# Patient Record
Sex: Female | Born: 1980
Health system: Southern US, Community
[De-identification: ages and names within clinical notes are randomized; demographics above are authoritative.]

## PROBLEM LIST (undated history)

## (undated) DIAGNOSIS — O24419 Gestational diabetes mellitus in pregnancy, unspecified control: Secondary | ICD-10-CM

## (undated) DIAGNOSIS — O09529 Supervision of elderly multigravida, unspecified trimester: Secondary | ICD-10-CM

## (undated) DIAGNOSIS — R87629 Unspecified abnormal cytological findings in specimens from vagina: Secondary | ICD-10-CM

## (undated) DIAGNOSIS — D259 Leiomyoma of uterus, unspecified: Secondary | ICD-10-CM

## (undated) DIAGNOSIS — D573 Sickle-cell trait: Secondary | ICD-10-CM

## (undated) DIAGNOSIS — Z889 Allergy status to unspecified drugs, medicaments and biological substances status: Secondary | ICD-10-CM

## (undated) DIAGNOSIS — K8020 Calculus of gallbladder without cholecystitis without obstruction: Secondary | ICD-10-CM

## (undated) HISTORY — DX: Sickle-cell trait: D57.3

## (undated) HISTORY — DX: Supervision of elderly multigravida, unspecified trimester: O09.529

## (undated) HISTORY — DX: Unspecified abnormal cytological findings in specimens from vagina: R87.629

## (undated) HISTORY — DX: Allergy status to unspecified drugs, medicaments and biological substances: Z88.9

## (undated) HISTORY — PX: OTHER SURGICAL HISTORY: SHX169

## (undated) HISTORY — PX: LEG SURGERY: SHX1003

## (undated) HISTORY — DX: Calculus of gallbladder without cholecystitis without obstruction: K80.20

---

## 2000-10-19 ENCOUNTER — Emergency Department: Admission: EM | Admit: 2000-10-19 | Discharge: 2000-10-19 | Payer: Self-pay | Admitting: Emergency Medicine

## 2002-06-28 ENCOUNTER — Emergency Department (HOSPITAL_COMMUNITY): Admission: EM | Admit: 2002-06-28 | Discharge: 2002-06-28 | Payer: Self-pay | Admitting: Emergency Medicine

## 2002-06-28 ENCOUNTER — Encounter: Payer: Self-pay | Admitting: Emergency Medicine

## 2003-03-10 ENCOUNTER — Encounter: Admission: RE | Admit: 2003-03-10 | Discharge: 2003-04-04 | Payer: Self-pay | Admitting: Plastic Surgery

## 2003-07-06 ENCOUNTER — Other Ambulatory Visit: Admission: RE | Admit: 2003-07-06 | Discharge: 2003-07-06 | Payer: Self-pay | Admitting: Internal Medicine

## 2004-05-15 ENCOUNTER — Ambulatory Visit: Payer: Self-pay | Admitting: Internal Medicine

## 2004-05-21 ENCOUNTER — Ambulatory Visit: Payer: Self-pay | Admitting: Internal Medicine

## 2004-05-31 ENCOUNTER — Emergency Department (HOSPITAL_COMMUNITY): Admission: EM | Admit: 2004-05-31 | Discharge: 2004-05-31 | Payer: Self-pay | Admitting: Emergency Medicine

## 2004-06-01 ENCOUNTER — Ambulatory Visit: Payer: Self-pay | Admitting: Internal Medicine

## 2004-06-12 ENCOUNTER — Other Ambulatory Visit: Admission: RE | Admit: 2004-06-12 | Discharge: 2004-06-12 | Payer: Self-pay | Admitting: Obstetrics and Gynecology

## 2004-10-30 ENCOUNTER — Other Ambulatory Visit: Admission: RE | Admit: 2004-10-30 | Discharge: 2004-10-30 | Payer: Self-pay | Admitting: Obstetrics and Gynecology

## 2004-12-04 ENCOUNTER — Encounter: Admission: RE | Admit: 2004-12-04 | Discharge: 2004-12-04 | Payer: Self-pay | Admitting: Obstetrics & Gynecology

## 2004-12-14 ENCOUNTER — Inpatient Hospital Stay (HOSPITAL_COMMUNITY): Admission: AD | Admit: 2004-12-14 | Discharge: 2004-12-16 | Payer: Self-pay | Admitting: Obstetrics and Gynecology

## 2005-01-17 ENCOUNTER — Other Ambulatory Visit: Admission: RE | Admit: 2005-01-17 | Discharge: 2005-01-17 | Payer: Self-pay | Admitting: Obstetrics and Gynecology

## 2005-05-22 ENCOUNTER — Ambulatory Visit: Payer: Self-pay | Admitting: Internal Medicine

## 2006-09-09 ENCOUNTER — Ambulatory Visit: Payer: Self-pay | Admitting: Family Medicine

## 2006-09-09 DIAGNOSIS — J309 Allergic rhinitis, unspecified: Secondary | ICD-10-CM | POA: Insufficient documentation

## 2007-07-14 ENCOUNTER — Encounter: Admission: RE | Admit: 2007-07-14 | Discharge: 2007-07-14 | Payer: Self-pay | Admitting: Obstetrics and Gynecology

## 2007-08-11 ENCOUNTER — Inpatient Hospital Stay (HOSPITAL_COMMUNITY): Admission: AD | Admit: 2007-08-11 | Discharge: 2007-08-11 | Payer: Self-pay | Admitting: Obstetrics and Gynecology

## 2007-09-02 ENCOUNTER — Inpatient Hospital Stay (HOSPITAL_COMMUNITY): Admission: AD | Admit: 2007-09-02 | Discharge: 2007-09-04 | Payer: Self-pay | Admitting: Obstetrics and Gynecology

## 2008-02-03 ENCOUNTER — Ambulatory Visit: Payer: Self-pay | Admitting: Internal Medicine

## 2008-02-03 LAB — CONVERTED CEMR LAB: Rapid Strep: NEGATIVE

## 2008-05-17 ENCOUNTER — Ambulatory Visit: Payer: Self-pay | Admitting: Internal Medicine

## 2008-06-21 ENCOUNTER — Encounter: Payer: Self-pay | Admitting: Internal Medicine

## 2010-01-16 ENCOUNTER — Encounter: Payer: Self-pay | Admitting: Internal Medicine

## 2010-03-15 NOTE — Consult Note (Signed)
Summary: leg lenght discrepancy----Orthopaedics  [facility]   Imported By: Lanelle Bal 02/01/2010 11:33:40  _____________________________________________________________________  External Attachment:    Type:   Image     Comment:   External Document

## 2010-08-29 ENCOUNTER — Encounter: Payer: Self-pay | Admitting: Internal Medicine

## 2010-08-31 ENCOUNTER — Encounter: Payer: Self-pay | Admitting: Internal Medicine

## 2010-08-31 ENCOUNTER — Ambulatory Visit (INDEPENDENT_AMBULATORY_CARE_PROVIDER_SITE_OTHER): Payer: Managed Care, Other (non HMO) | Admitting: Internal Medicine

## 2010-08-31 DIAGNOSIS — Z0000 Encounter for general adult medical examination without abnormal findings: Secondary | ICD-10-CM

## 2010-08-31 DIAGNOSIS — Z23 Encounter for immunization: Secondary | ICD-10-CM

## 2010-08-31 LAB — COMPREHENSIVE METABOLIC PANEL
ALT: 21 U/L (ref 0–35)
BUN: 12 mg/dL (ref 6–23)
CO2: 26 mEq/L (ref 19–32)
Calcium: 9.3 mg/dL (ref 8.4–10.5)
Chloride: 104 mEq/L (ref 96–112)
Creat: 0.8 mg/dL (ref 0.50–1.10)
Glucose, Bld: 81 mg/dL (ref 70–99)

## 2010-08-31 LAB — CBC WITH DIFFERENTIAL/PLATELET
Eosinophils Absolute: 0.1 10*3/uL (ref 0.0–0.7)
Eosinophils Relative: 2 % (ref 0–5)
HCT: 38.3 % (ref 36.0–46.0)
Hemoglobin: 12.5 g/dL (ref 12.0–15.0)
Lymphocytes Relative: 40 % (ref 12–46)
Lymphs Abs: 2 10*3/uL (ref 0.7–4.0)
MCH: 28.7 pg (ref 26.0–34.0)
MCV: 87.8 fL (ref 78.0–100.0)
Monocytes Absolute: 0.4 10*3/uL (ref 0.1–1.0)
Monocytes Relative: 8 % (ref 3–12)
RBC: 4.36 MIL/uL (ref 3.87–5.11)
WBC: 5 10*3/uL (ref 4.0–10.5)

## 2010-08-31 LAB — TSH: TSH: 0.683 u[IU]/mL (ref 0.350–4.500)

## 2010-08-31 LAB — LIPID PANEL
Cholesterol: 141 mg/dL (ref 0–200)
HDL: 36 mg/dL — ABNORMAL LOW (ref >39)

## 2010-08-31 NOTE — Assessment & Plan Note (Addendum)
Td today Labs Diet-exercise discussed Needs documentation (proof of physical, shots) , will issue one with labs results

## 2010-08-31 NOTE — Progress Notes (Signed)
  Subjective:    Patient ID: [name] [name], female    DOB: 05/28/1980, 30 y.o.   MRN: [id number]  HPI CPX Past Medical History  Diagnosis Date  . Multiple allergies     chronic sneezing   Past Surgical History  Procedure Date  . Leg surgery     --R--from absecss with reconstructive flap- 2004  . Leg discrepancy     corrected 2005 per pt    Family History  Problem Relation Age of Onset  . Diabetes      GM  . Coronary artery disease Neg Hx   . Colon cancer Neg Hx   . Breast cancer Neg Hx    History   Social History  . Marital Status: Married    Spouse Name: N/A    Number of Children: 2  . Years of Education: N/A   Occupational History  . 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 Smoking status: Never Smoker   . Smokeless tobacco: Not on file  . Alcohol Use: No  . Drug Use: No  . Sexually Active: Not on file   Other Topics Concern  . Not on file   Social History Narrative   Original from [place]----diet: trying to eat healthy but gas gain wt-- going to the gym      Review of Systems  Constitutional: Negative for fever, fatigue and unexpected weight change.  Respiratory: Negative for cough and shortness of breath.   Gastrointestinal: Negative for blood in stool and abdominal distention.  Genitourinary: Negative for dysuria and difficulty urinating.  Psychiatric/Behavioral:       No anxiety-depression       Objective:   Physical Exam  Constitutional: She is oriented to person, place, and time. She appears well-developed and well-nourished. No distress.  HENT:  Head: Normocephalic and atraumatic.  Eyes: No scleral icterus.  Neck: Normal range of motion. Neck supple. No thyromegaly present.  Cardiovascular: Normal rate, regular rhythm and normal heart sounds.   No murmur heard. Pulmonary/Chest: Effort normal and breath sounds normal. No respiratory distress. She has no wheezes. She has no rales.  Musculoskeletal: She exhibits no edema.    Neurological: She is alert and oriented to person, place, and time.  Skin: Skin is warm and dry.  Psychiatric: She has a normal mood and affect. Her behavior is normal. Judgment and thought content normal.          Assessment & Plan:

## 2010-09-03 ENCOUNTER — Other Ambulatory Visit: Payer: Self-pay | Admitting: Internal Medicine

## 2010-09-03 ENCOUNTER — Other Ambulatory Visit: Payer: Managed Care, Other (non HMO)

## 2010-09-03 NOTE — Progress Notes (Signed)
Labs only

## 2010-09-04 LAB — HEPATITIS A ANTIBODY, TOTAL: Hep A Total Ab: POSITIVE — AB

## 2010-09-06 ENCOUNTER — Telehealth: Payer: Self-pay | Admitting: *Deleted

## 2010-09-06 ENCOUNTER — Encounter: Payer: Self-pay | Admitting: Internal Medicine

## 2010-09-06 NOTE — Telephone Encounter (Signed)
done

## 2010-09-06 NOTE — Telephone Encounter (Signed)
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 I do not see any letters in her chart. Please advise.

## 2010-09-06 NOTE — Telephone Encounter (Signed)
Left detailed message that letter was ready.

## 2010-09-11 ENCOUNTER — Encounter: Payer: Self-pay | Admitting: Internal Medicine

## 2010-11-09 LAB — CBC
HCT: 35.3 — ABNORMAL LOW
MCHC: 33.3
Platelets: 161
RBC: 3.89
RDW: 15.4
RDW: 15.5

## 2010-11-09 LAB — RPR: RPR Ser Ql: NONREACTIVE

## 2011-06-04 ENCOUNTER — Ambulatory Visit (INDEPENDENT_AMBULATORY_CARE_PROVIDER_SITE_OTHER): Payer: BC Managed Care – PPO | Admitting: Internal Medicine

## 2011-06-04 ENCOUNTER — Encounter: Payer: Self-pay | Admitting: Internal Medicine

## 2011-06-04 VITALS — BP 128/78 | HR 77 | Temp 97.4°F | Wt 201.0 lb

## 2011-06-04 DIAGNOSIS — S99919A Unspecified injury of unspecified ankle, initial encounter: Secondary | ICD-10-CM

## 2011-06-04 DIAGNOSIS — J309 Allergic rhinitis, unspecified: Secondary | ICD-10-CM

## 2011-06-04 DIAGNOSIS — S99929A Unspecified injury of unspecified foot, initial encounter: Secondary | ICD-10-CM

## 2011-06-04 DIAGNOSIS — S8990XA Unspecified injury of unspecified lower leg, initial encounter: Secondary | ICD-10-CM

## 2011-06-04 MED ORDER — NEDOCROMIL SODIUM 2 % OP SOLN: 1.0000 [drp] | Freq: Two times a day (BID) | OPHTHALMIC | Status: AC

## 2011-06-04 MED ORDER — PREDNISONE 10 MG PO TABS: ORAL_TABLET | ORAL | Status: DC

## 2011-06-04 MED ORDER — FLUTICASONE PROPIONATE 50 MCG/ACT NA SUSP: 2.0000 | Freq: Every day | NASAL | Status: DC

## 2011-06-04 NOTE — Progress Notes (Signed)
  Subjective:    Patient ID: [name] [name], female    DOB: May 08, 1980, 31 y.o.   MRN: [id number]  HPI Acute visit Has a long history of severe allergies, she was doing well until 2 weeks ago when she developed severe sneezing, redness and itchy eyes. Currently taking Zyrtec and over-the-counter eyedrops. Also, has chronic problems with her right leg. Likes to see a local orthopedic doctor, see assessment and plan.  Past Medical History  Diagnosis Date  . Multiple allergies     chronic sneezing   Past Surgical History  Procedure Date  . Leg surgery     --R--from absecss with reconstructive flap- 2004  . Leg discrepancy      Review of Systems Denies cough, wheezing. No chills but for the last 3 days she had some subjective fever and sore throat. Slightly sore at the "glands of the neck"  She does not use birth control pills but her last period was  this week. She denies any history of previous eye problems such as glaucoma.    Objective:   Physical Exam General -- alert, well-developed. No apparent distress.  Neck -- few small LADs at the side of the neck, slt tender  HEENT -- TMs normal, throat w/o redness, face symmetric and not tender to palpation, nose congested, conjuntiva moderately red, eyes slt watery; EOMI, PERRLA Lungs -- normal respiratory effort, no intercostal retractions, no accessory muscle use, and normal breath sounds.   Heart-- normal rate, regular rhythm, no murmur, and no gallop.   Psych-- Cognition and judgment appear intact. Alert and cooperative with normal attention span and concentration.  not anxious appearing and not depressed appearing.       Assessment & Plan:

## 2011-06-04 NOTE — Assessment & Plan Note (Addendum)
History of leg surgery from abscess with reconstructive flap  - 2004,  history of leg discrepancy, more recently she was evaluated at [facility] (2011) ; still has pain on and off, would like to be referred to a local orthopedic doctor. Referral done.

## 2011-06-04 NOTE — Patient Instructions (Addendum)
Continue with Zyrtec Start Flonase every day  Prednisone for 5 days. Soto the OTC  eyedrops, start Alocril Call if not better.

## 2011-06-04 NOTE — Assessment & Plan Note (Addendum)
Severe allergic rhinitis and also allergic conjunctivitis; symptoms worse for the last 2 weeks. Some sore throat and subjective fever for a few days however no overt infection on exam. Plan: Continue his Zyrtec Flonase Prednisone for a few days

## 2011-06-05 ENCOUNTER — Encounter: Payer: Self-pay | Admitting: Internal Medicine

## 2011-06-05 ENCOUNTER — Ambulatory Visit: Payer: Managed Care, Other (non HMO) | Admitting: Internal Medicine

## 2011-07-30 LAB — OB RESULTS CONSOLE RPR: RPR: NONREACTIVE

## 2011-07-30 LAB — OB RESULTS CONSOLE GC/CHLAMYDIA: Chlamydia: NEGATIVE

## 2011-07-30 LAB — OB RESULTS CONSOLE ABO/RH: RH Type: POSITIVE

## 2011-07-30 LAB — OB RESULTS CONSOLE GBS: GBS: NEGATIVE

## 2011-07-30 LAB — OB RESULTS CONSOLE HEPATITIS B SURFACE ANTIGEN: Hepatitis B Surface Ag: NEGATIVE

## 2011-07-30 LAB — OB RESULTS CONSOLE HIV ANTIBODY (ROUTINE TESTING): HIV: NONREACTIVE

## 2011-07-30 LAB — OB RESULTS CONSOLE ANTIBODY SCREEN: Antibody Screen: NEGATIVE

## 2011-07-31 LAB — OB RESULTS CONSOLE HEPATITIS B SURFACE ANTIGEN: Hepatitis B Surface Ag: NEGATIVE

## 2011-07-31 LAB — OB RESULTS CONSOLE GC/CHLAMYDIA: Chlamydia: NEGATIVE

## 2011-07-31 LAB — OB RESULTS CONSOLE ABO/RH

## 2011-07-31 LAB — OB RESULTS CONSOLE ANTIBODY SCREEN: Antibody Screen: NEGATIVE

## 2011-08-03 LAB — OB RESULTS CONSOLE ABO/RH: RH Type: POSITIVE

## 2011-08-28 ENCOUNTER — Ambulatory Visit: Payer: BC Managed Care – PPO

## 2011-09-04 ENCOUNTER — Ambulatory Visit: Payer: BC Managed Care – PPO

## 2011-09-11 ENCOUNTER — Encounter: Payer: BC Managed Care – PPO | Attending: Obstetrics and Gynecology

## 2012-01-15 ENCOUNTER — Encounter: Payer: BC Managed Care – PPO | Attending: Obstetrics and Gynecology | Admitting: *Deleted

## 2012-01-15 VITALS — Ht 68.0 in | Wt 229.3 lb

## 2012-01-15 DIAGNOSIS — O99810 Abnormal glucose complicating pregnancy: Secondary | ICD-10-CM

## 2012-01-15 DIAGNOSIS — Z713 Dietary counseling and surveillance: Secondary | ICD-10-CM | POA: Insufficient documentation

## 2012-01-16 ENCOUNTER — Encounter: Payer: Self-pay | Admitting: *Deleted

## 2012-01-16 NOTE — Progress Notes (Signed)
  Patient was seen on 01/15/12 for Gestational Diabetes self-management class at the [facility]. The following learning objectives were met by the patient during this course:   States the definition of Gestational Diabetes  States why dietary management is important in controlling blood glucose  Describes the effects each nutrient has on blood glucose levels  Demonstrates ability to create a balanced meal plan  Demonstrates carbohydrate counting   States when to check blood glucose levels  Demonstrates proper blood glucose monitoring techniques  States the effect of stress and exercise on blood glucose levels  States the importance of limiting caffeine and abstaining from alcohol and smoking  Blood glucose monitor given: Accu Chek Aviva Plus BG Monitoring Kit Lot # [id number] Exp: 03/13/13 Blood glucose reading: 138 mg/dl  Patient instructed to monitor glucose levels: FBS: 60 - <90 2 hour: <120  *Patient received handouts:  Nutrition Diabetes and Pregnancy  Carbohydrate Counting List  Patient will be seen for follow-up as needed.

## 2012-01-16 NOTE — Patient Instructions (Signed)
Goals:  Check glucose levels per MD as instructed  Follow Gestational Diabetes Diet as instructed  Call for follow-up as needed    

## 2012-02-28 ENCOUNTER — Telehealth (HOSPITAL_COMMUNITY): Payer: Self-pay | Admitting: *Deleted

## 2012-02-28 ENCOUNTER — Encounter (HOSPITAL_COMMUNITY): Payer: Self-pay | Admitting: *Deleted

## 2012-02-28 NOTE — Telephone Encounter (Signed)
Preadmission screen  

## 2012-03-02 ENCOUNTER — Inpatient Hospital Stay (HOSPITAL_COMMUNITY): Admission: RE | Admit: 2012-03-02 | Payer: BC Managed Care – PPO | Source: Ambulatory Visit

## 2012-03-04 ENCOUNTER — Inpatient Hospital Stay (HOSPITAL_COMMUNITY)
Admission: AD | Admit: 2012-03-04 | Discharge: 2012-03-07 | DRG: 372 | Disposition: A | Discharge status: Home / Self Care | Payer: BC Managed Care – PPO | Source: Ambulatory Visit | Attending: Obstetrics and Gynecology | Admitting: Obstetrics and Gynecology

## 2012-03-04 ENCOUNTER — Inpatient Hospital Stay (HOSPITAL_COMMUNITY): Payer: BC Managed Care – PPO | Admitting: Anesthesiology

## 2012-03-04 ENCOUNTER — Encounter (HOSPITAL_COMMUNITY): Payer: Self-pay | Admitting: *Deleted

## 2012-03-04 ENCOUNTER — Encounter (HOSPITAL_COMMUNITY): Payer: Self-pay | Admitting: Anesthesiology

## 2012-03-04 DIAGNOSIS — D573 Sickle-cell trait: Secondary | ICD-10-CM | POA: Diagnosis present

## 2012-03-04 DIAGNOSIS — O9902 Anemia complicating childbirth: Secondary | ICD-10-CM | POA: Diagnosis present

## 2012-03-04 DIAGNOSIS — D259 Leiomyoma of uterus, unspecified: Secondary | ICD-10-CM | POA: Diagnosis present

## 2012-03-04 DIAGNOSIS — J309 Allergic rhinitis, unspecified: Secondary | ICD-10-CM

## 2012-03-04 DIAGNOSIS — S8990XA Unspecified injury of unspecified lower leg, initial encounter: Secondary | ICD-10-CM

## 2012-03-04 DIAGNOSIS — IMO0001 Reserved for inherently not codable concepts without codable children: Secondary | ICD-10-CM

## 2012-03-04 DIAGNOSIS — D4959 Neoplasm of unspecified behavior of other genitourinary organ: Secondary | ICD-10-CM | POA: Diagnosis present

## 2012-03-04 DIAGNOSIS — O99814 Abnormal glucose complicating childbirth: Principal | ICD-10-CM | POA: Diagnosis present

## 2012-03-04 DIAGNOSIS — O24419 Gestational diabetes mellitus in pregnancy, unspecified control: Secondary | ICD-10-CM | POA: Diagnosis present

## 2012-03-04 DIAGNOSIS — D219 Benign neoplasm of connective and other soft tissue, unspecified: Secondary | ICD-10-CM

## 2012-03-04 DIAGNOSIS — O34599 Maternal care for other abnormalities of gravid uterus, unspecified trimester: Secondary | ICD-10-CM | POA: Diagnosis present

## 2012-03-04 HISTORY — DX: Gestational diabetes mellitus in pregnancy, unspecified control: O24.419

## 2012-03-04 HISTORY — DX: Leiomyoma of uterus, unspecified: D25.9

## 2012-03-04 LAB — CBC
MCV: 110.4 fL — ABNORMAL HIGH (ref 78.0–100.0)
Platelets: 218 10*3/uL (ref 150–400)
RDW: 16.1 % — ABNORMAL HIGH (ref 11.5–15.5)
WBC: 7.5 10*3/uL (ref 4.0–10.5)

## 2012-03-04 MED ORDER — SODIUM BICARBONATE 8.4 % IV SOLN
INTRAVENOUS | Status: DC | PRN
  Administered 2012-03-04: 5 mL via EPIDURAL

## 2012-03-04 MED ORDER — SIMETHICONE 80 MG PO CHEW: 80.0000 mg | CHEWABLE_TABLET | ORAL | Status: DC | PRN

## 2012-03-04 MED ORDER — PHENYLEPHRINE 40 MCG/ML (10ML) SYRINGE FOR IV PUSH (FOR BLOOD PRESSURE SUPPORT)
80.0000 ug | PREFILLED_SYRINGE | INTRAVENOUS | Status: AC | PRN
  Administered 2012-03-04 (×3): 80 ug via INTRAVENOUS

## 2012-03-04 MED ORDER — PRENATAL MULTIVITAMIN CH
1.0000 | ORAL_TABLET | Freq: Every day | ORAL | Status: DC
  Administered 2012-03-04 – 2012-03-06 (×3): 1 via ORAL
  Filled 2012-03-04 (×3): qty 1

## 2012-03-04 MED ORDER — ONDANSETRON HCL 4 MG PO TABS: 4.0000 mg | ORAL_TABLET | ORAL | Status: DC | PRN

## 2012-03-04 MED ORDER — OXYCODONE-ACETAMINOPHEN 5-325 MG PO TABS: 1.0000 | ORAL_TABLET | ORAL | Status: DC | PRN

## 2012-03-04 MED ORDER — OXYTOCIN BOLUS FROM INFUSION
500.0000 mL | INTRAVENOUS | Status: DC
  Administered 2012-03-04: 500 mL via INTRAVENOUS

## 2012-03-04 MED ORDER — PHENYLEPHRINE 40 MCG/ML (10ML) SYRINGE FOR IV PUSH (FOR BLOOD PRESSURE SUPPORT)
80.0000 ug | PREFILLED_SYRINGE | INTRAVENOUS | Status: DC | PRN
  Administered 2012-03-04: 80 ug via INTRAVENOUS

## 2012-03-04 MED ORDER — LANOLIN HYDROUS EX OINT: TOPICAL_OINTMENT | CUTANEOUS | Status: DC | PRN

## 2012-03-04 MED ORDER — LACTATED RINGERS IV SOLN: 500.0000 mL | INTRAVENOUS | Status: DC | PRN

## 2012-03-04 MED ORDER — ONDANSETRON HCL 4 MG/2ML IJ SOLN: 4.0000 mg | INTRAMUSCULAR | Status: DC | PRN

## 2012-03-04 MED ORDER — FLEET ENEMA 7-19 GM/118ML RE ENEM: 1.0000 | ENEMA | RECTAL | Status: DC | PRN

## 2012-03-04 MED ORDER — CITRIC ACID-SODIUM CITRATE 334-500 MG/5ML PO SOLN: 30.0000 mL | ORAL | Status: DC | PRN

## 2012-03-04 MED ORDER — IBUPROFEN 600 MG PO TABS
600.0000 mg | ORAL_TABLET | Freq: Four times a day (QID) | ORAL | Status: DC
  Administered 2012-03-04 – 2012-03-07 (×11): 600 mg via ORAL
  Filled 2012-03-04 (×12): qty 1

## 2012-03-04 MED ORDER — SENNOSIDES-DOCUSATE SODIUM 8.6-50 MG PO TABS
2.0000 | ORAL_TABLET | Freq: Every day | ORAL | Status: DC
  Administered 2012-03-04 – 2012-03-06 (×3): 2 via ORAL

## 2012-03-04 MED ORDER — BISACODYL 10 MG RE SUPP: 10.0000 mg | Freq: Every day | RECTAL | Status: DC | PRN

## 2012-03-04 MED ORDER — WITCH HAZEL-GLYCERIN EX PADS: 1.0000 "application " | MEDICATED_PAD | CUTANEOUS | Status: DC | PRN

## 2012-03-04 MED ORDER — DIPHENHYDRAMINE HCL 25 MG PO CAPS
25.0000 mg | ORAL_CAPSULE | Freq: Four times a day (QID) | ORAL | Status: DC | PRN
  Administered 2012-03-04 (×2): 25 mg via ORAL
  Filled 2012-03-04 (×3): qty 1

## 2012-03-04 MED ORDER — LIDOCAINE HCL (PF) 1 % IJ SOLN
30.0000 mL | INTRAMUSCULAR | Status: DC | PRN
  Filled 2012-03-04: qty 30

## 2012-03-04 MED ORDER — OXYTOCIN 40 UNITS IN LACTATED RINGERS INFUSION - SIMPLE MED
62.5000 mL/h | INTRAVENOUS | Status: DC
  Filled 2012-03-04: qty 1000

## 2012-03-04 MED ORDER — FENTANYL 2.5 MCG/ML BUPIVACAINE 1/10 % EPIDURAL INFUSION (WH - ANES)
14.0000 mL/h | INTRAMUSCULAR | Status: DC
  Administered 2012-03-04: 14 mL/h via EPIDURAL
  Filled 2012-03-04: qty 125

## 2012-03-04 MED ORDER — DIBUCAINE 1 % RE OINT: 1.0000 "application " | TOPICAL_OINTMENT | RECTAL | Status: DC | PRN

## 2012-03-04 MED ORDER — PHENYLEPHRINE 40 MCG/ML (10ML) SYRINGE FOR IV PUSH (FOR BLOOD PRESSURE SUPPORT)
80.0000 ug | PREFILLED_SYRINGE | INTRAVENOUS | Status: AC | PRN
  Administered 2012-03-04 (×3): 80 ug via INTRAVENOUS
  Filled 2012-03-04: qty 10
  Filled 2012-03-04: qty 5

## 2012-03-04 MED ORDER — EPHEDRINE 5 MG/ML INJ
10.0000 mg | INTRAVENOUS | Status: DC | PRN
  Filled 2012-03-04: qty 4

## 2012-03-04 MED ORDER — BENZOCAINE-MENTHOL 20-0.5 % EX AERO
1.0000 "application " | INHALATION_SPRAY | CUTANEOUS | Status: DC | PRN
  Administered 2012-03-04: 1 via TOPICAL
  Filled 2012-03-04: qty 56

## 2012-03-04 MED ORDER — ACETAMINOPHEN 325 MG PO TABS: 650.0000 mg | ORAL_TABLET | ORAL | Status: DC | PRN

## 2012-03-04 MED ORDER — ZOLPIDEM TARTRATE 5 MG PO TABS
5.0000 mg | ORAL_TABLET | Freq: Every evening | ORAL | Status: DC | PRN
  Administered 2012-03-05: 5 mg via ORAL
  Filled 2012-03-04: qty 1

## 2012-03-04 MED ORDER — TETANUS-DIPHTH-ACELL PERTUSSIS 5-2.5-18.5 LF-MCG/0.5 IM SUSP: 0.5000 mL | Freq: Once | INTRAMUSCULAR | Status: DC

## 2012-03-04 MED ORDER — ONDANSETRON HCL 4 MG/2ML IJ SOLN: 4.0000 mg | Freq: Four times a day (QID) | INTRAMUSCULAR | Status: DC | PRN

## 2012-03-04 MED ORDER — DIPHENHYDRAMINE HCL 50 MG/ML IJ SOLN: 12.5000 mg | INTRAMUSCULAR | Status: DC | PRN

## 2012-03-04 MED ORDER — LACTATED RINGERS IV SOLN
INTRAVENOUS | Status: DC
  Administered 2012-03-04: 07:00:00 via INTRAVENOUS

## 2012-03-04 MED ORDER — IBUPROFEN 600 MG PO TABS: 600.0000 mg | ORAL_TABLET | Freq: Four times a day (QID) | ORAL | Status: DC | PRN

## 2012-03-04 MED ORDER — FLEET ENEMA 7-19 GM/118ML RE ENEM: 1.0000 | ENEMA | Freq: Every day | RECTAL | Status: DC | PRN

## 2012-03-04 MED ORDER — OXYCODONE-ACETAMINOPHEN 5-325 MG PO TABS
1.0000 | ORAL_TABLET | ORAL | Status: DC | PRN
  Administered 2012-03-05 – 2012-03-06 (×4): 2 via ORAL
  Filled 2012-03-04: qty 2
  Filled 2012-03-04: qty 1
  Filled 2012-03-04 (×3): qty 2

## 2012-03-04 MED ORDER — EPHEDRINE 5 MG/ML INJ: 10.0000 mg | INTRAVENOUS | Status: DC | PRN

## 2012-03-04 MED ORDER — LACTATED RINGERS IV SOLN: 500.0000 mL | Freq: Once | INTRAVENOUS | Status: DC

## 2012-03-04 NOTE — Anesthesia Postprocedure Evaluation (Signed)
  Anesthesia Post-op Note  Patient: [name] [name]  Procedure(s) Performed: * No procedures listed *  Patient Location: Women's Unit  Anesthesia Type:Epidural  Level of Consciousness: awake  Airway and Oxygen Therapy: Patient Spontanous Breathing  Post-op Pain: mild  Post-op Assessment: Patient's Cardiovascular Status Stable and Respiratory Function Stable  Post-op Vital Signs: stable  Complications: No apparent anesthesia complications

## 2012-03-04 NOTE — Anesthesia Preprocedure Evaluation (Signed)
Anesthesia Evaluation  Patient identified by MRN, date of birth, ID band Patient awake    Reviewed: Allergy & Precautions, H&P , Patient's Chart, lab work & pertinent test results  Airway Mallampati: II TM Distance: >3 FB Neck ROM: full    Dental No notable dental hx.    Pulmonary  breath sounds clear to auscultation  Pulmonary exam normal       Cardiovascular Exercise Tolerance: Good Rhythm:regular Rate:Normal     Neuro/Psych    GI/Hepatic   Endo/Other  diabetesMorbid obesity  Renal/GU      Musculoskeletal   Abdominal   Peds  Hematology   Anesthesia Other Findings   Reproductive/Obstetrics                           Anesthesia Physical Anesthesia Plan  ASA: III  Anesthesia Plan: Epidural   Post-op Pain Management:    Induction:   Airway Management Planned:   Additional Equipment:   Intra-op Plan:   Post-operative Plan:   Informed Consent: I have reviewed the patients History and Physical, chart, labs and discussed the procedure including the risks, benefits and alternatives for the proposed anesthesia with the patient or authorized representative who has indicated his/her understanding and acceptance.   Dental Advisory Given  Plan Discussed with:   Anesthesia Plan Comments: (Labs checked- platelets confirmed with RN in room. Fetal heart tracing, per RN, reported to be stable enough for sitting procedure. Discussed epidural, and patient consents to the procedure:  included risk of possible headache,backache, failed block, allergic reaction, and nerve injury. This patient was asked if she had any questions or concerns before the procedure started. )        Anesthesia Quick Evaluation

## 2012-03-04 NOTE — H&P (Signed)
[name] [name] is a 32 y.o. female presenting for C/O UCs. No HA, vision change, epigastric pain, ROM. PNC complicated by GDM diet control and uterine fibroids and sickle trait. Maternal Medical History:  Reason for admission: Reason for admission: contractions.  Contractions: Onset was 6-12 hours ago.    Fetal activity: Perceived fetal activity is normal.      OB History    Grav Para Term Preterm Abortions TAB SAB Ect Mult Living   3 2 2  0 0 0 0 0 0 2     Past Medical History  Diagnosis Date  . Multiple allergies     chronic sneezing  . Diabetes mellitus without complication   . Uterine fibroid   . Gestational diabetes    Past Surgical History  Procedure Date  . Leg surgery     --R--from absecss with reconstructive flap- 2004  . Leg discrepancy    Family History: family history includes Diabetes in an unspecified family member.  There is no history of Coronary artery disease, and Colon cancer, and Breast cancer, . Social History:  reports that she has never smoked. She does not have any smokeless tobacco history on file. She reports that she does not drink alcohol or use illicit drugs.   Prenatal Transfer Tool  Maternal Diabetes: Yes:  Diabetes Type:  Diet controlled Genetic Screening: Normal Maternal Ultrasounds/Referrals: Normal Fetal Ultrasounds or other Referrals:  None Maternal Substance Abuse:  No Significant Maternal Medications:  None Significant Maternal Lab Results:  None Other Comments:  positve for sickle trait  Review of Systems  Eyes: Negative for blurred vision.  Gastrointestinal: Negative for abdominal pain.  Neurological: Negative for headaches.    Dilation: 10 Effacement (%): 90 Station: 0 Exam by:: Dr Henderson Cloud Blood pressure 87/53, pulse 113, temperature 98 F (36.7 C), temperature source Oral, resp. rate 20, height 5' 7.5" (1.715 m), weight 233 lb (105.688 kg), SpO2 100.00%. Maternal Exam:  Uterine Assessment: Contraction strength is firm.   Contraction frequency is regular.   Abdomen: Fetal presentation: vertex     Fetal Exam Fetal Monitor Review: Pattern: accelerations present.       Physical Exam  Cardiovascular: Normal rate and regular rhythm.   Respiratory: Effort normal and breath sounds normal.  GI: Soft. There is no tenderness.  Neurological: She has normal reflexes.   Cx C/C/0 vtx  LOT AROM clear Prenatal labs: ABO, Rh: --/--/B POS, B POS (01/22 0350) Antibody: NEG (01/22 0350) Rubella: Immune (06/19 0000) RPR: Nonreactive (06/19 0000)  HBsAg: Negative (06/19 0000)  HIV: Non-reactive (06/19 0000)  GBS: Negative (06/19 0000)   Assessment/Plan: 32 yo G3P2 at 39 3/7 weeks in active labor Anticipate vaginal delivery    Coleston Dirosa II,Kasen Adduci E 03/04/2012, 8:22 AM

## 2012-03-04 NOTE — MAU Note (Signed)
contractions 

## 2012-03-04 NOTE — Progress Notes (Signed)
Dr Jean Rosenthal notified of pt blood pressure. Orders received to repeat Phenylephrine up to every minute as needed to maintain systolic at 120. RN to call anesthesia if 3 Phenylephrine syringes are used.

## 2012-03-04 NOTE — Progress Notes (Signed)
Delivery Note At 8:41 AM a viable female was delivered via  (Presentation:vtx ; OA ).  APGAR:8 ,9 ; weight pending .   Placenta status:intact, to path .  Cord: 3 vessel  with the following complications:none .  Cord pH: pending  Anesthesia: Epidural  Episiotomy: none                                                     Lacerations: first degree ML lac Suture Repair: none Est. Blood Loss (mL): 350 cc   Mom to postpartum.  Baby to nursery-stable.  Garald Rhew II,Keyry Iracheta E 03/04/2012, 8:52 AM

## 2012-03-04 NOTE — Progress Notes (Signed)
03/04/12 1200  Clinical Encounter Type  Visited With Patient  Visit Type Initial;Spiritual support;Social support  Referral From Nurse  Spiritual Encounters  Spiritual Needs Emotional    Had lovely, energetic initial visit with [name] [name], who valued theological conversation and having upbeat company.  We talked about church, marriage and family, faith and spirituality, education and career, and building relationship.    Provided pastoral presence, listening, reflection, encouragement, and spiritual companionship.  [name] [name] enthusiastically requests follow-up visits and is aware of ongoing chaplain availability, including in NICU.  [address] [place], [place] [phone]

## 2012-03-04 NOTE — Anesthesia Procedure Notes (Signed)

## 2012-03-05 LAB — CBC
MCH: 26.6 pg (ref 26.0–34.0)
MCHC: 32 g/dL (ref 30.0–36.0)
Platelets: 170 10*3/uL (ref 150–400)
RBC: 3.31 MIL/uL — ABNORMAL LOW (ref 3.87–5.11)

## 2012-03-05 LAB — GLUCOSE, CAPILLARY: Glucose-Capillary: 98 mg/dL (ref 70–99)

## 2012-03-05 MED ORDER — FERROUS SULFATE 325 (65 FE) MG PO TABS
325.0000 mg | ORAL_TABLET | Freq: Every day | ORAL | Status: DC
  Administered 2012-03-06: 325 mg via ORAL
  Filled 2012-03-05 (×2): qty 1

## 2012-03-05 NOTE — Addendum Note (Signed)
Addendum  created 03/05/12 1800 by Eusebio Friendly., MD   Modules edited:Inpatient Notes

## 2012-03-05 NOTE — Progress Notes (Signed)
I was asked to come evaluate this patient for evaluation of epidural headache.    On arrival she was sitting up in bed, talking on the phone laughing, and eating dinner.  Her headache was not postural and she did not display any other symptoms consistent with epidural headache.  She has been afebrile and vital signs have been stable.  I counseled her about epidural headaches and treatment options as well as symptoms that could be signs of serious conditions.  Her questions were answered and she understands how to proceed if she has worsening of her symptoms.

## 2012-03-05 NOTE — Progress Notes (Signed)
03/05/12 1600  Clinical Encounter Type  Visited With Patient  Visit Type Follow-up;Spiritual support;Social support  Spiritual Encounters  Spiritual Needs Emotional    Deanza was in good spirits (and appeared be struggling less with the guilt she has felt about not being able to control baby's sugars better) during this follow-up visit at baby Harriet's bedside in the NICU.  Maysa is using faith to cope.  She is eager to bring Nelliston home and hopes very much that they will both go home tomorrow.  Theo is very appreciative of all the care she and Berton Mount have received.  I provided pastoral listening, reflection, and encouragement.  [address] [place], [place] [phone]

## 2012-03-05 NOTE — Progress Notes (Signed)
Post Partum Day one Subjective: baby in NICU for low blood sugars  Objective: Blood pressure 113/75, pulse 87, temperature 97.9 F (36.6 C), temperature source Oral, resp. rate 18, height 5' 7.5" (1.715 m), weight 105.688 kg (233 lb), SpO2 97.00%, unknown if currently breastfeeding.  Physical Exam:  General: alert Lochia: appropriate Uterine Fundus: firm Incision: na DVT Evaluation: No evidence of DVT seen on physical exam.   Basename 03/05/12 0545 03/04/12 0350  HGB 8.8* 10.3*  HCT 27.5* 42.5    Assessment/Plan: Plan for discharge tomorrow   LOS: 1 day   Braleigh Massoud S 03/05/2012, 8:44 AM

## 2012-03-06 LAB — CBC
Hemoglobin: 9.7 g/dL — ABNORMAL LOW (ref 12.0–15.0)
MCHC: 31.9 g/dL (ref 30.0–36.0)
RDW: 16.4 % — ABNORMAL HIGH (ref 11.5–15.5)
WBC: 7.5 10*3/uL (ref 4.0–10.5)

## 2012-03-06 MED ORDER — BUTALBITAL-APAP-CAFFEINE 50-325-40 MG PO TABS
1.0000 | ORAL_TABLET | ORAL | Status: DC | PRN
  Administered 2012-03-06 – 2012-03-07 (×4): 1 via ORAL
  Filled 2012-03-06 (×4): qty 1

## 2012-03-06 NOTE — Clinical Social Work Maternal (Signed)
Clinical Social Work Department PSYCHOSOCIAL ASSESSMENT - MATERNAL/CHILD 03/05/2012  Patient:  [name],[name]  Account Number:  [id number]  Admit Date:  03/04/2012  Childs Name:   [name] [name]   Clinical Social Worker:  Lupita Rosales, LCSW   Date/Time:  03/05/2012 02:40 PM  Date Referred:  03/05/2012   Referral source NICU    Referred reason NICU  Other referral source:    I:  FAMILY / HOME ENVIRONMENT Child's legal guardian:  PARENT  Guardian - Name Guardian - Age Guardian - Address [name] [name] [address] [place], [place] [address] Harry Leavy    Other household support members/support persons Name Relationship DOB Hilary DAUGHTER 7 Harry SON 4  Other support:   MOB states her mother is visiting from [place] for an undertermined amount of time.  She states she has family in [place] and a brother is  who are good supports. FOB is involved and supportive.   II  PSYCHOSOCIAL DATA Information Source:  Patient Interview  Financial and Community Resources Employment:   FOB is working MOB is receiving unemployment at this time.  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  Financial resources:  Private Insurance If Medicaid - County:    School / Grade:   Maternity Care Coordinator / Child Services Coordination / Early Interventions:  Cultural issues impacting care:   MOB is originally from [place].  She has lived in the [place] for 8 [place] and is fluent in English.   III  STRENGTHS Strengths Adequate Resources Compliance with medical plan Home prepared for Child (including basic supplies) Other - See comment Supportive family/friends Understanding of illness  Strength comment:  Pediatric follow up will be with Dr. Dial at [facility].   IV  RISK FACTORS AND CURRENT PROBLEMS Current Problem:  None   Risk Factor & Current Problem Patient Issue Family Issue Risk Factor /  Current Problem Comment  N N    V  SOCIAL WORK ASSESSMENT CSW met with MOB in her third floor room/309 to introduce myself, complete assessment and evaluate how she is coping with baby's admission to NICU.  CSW explained support services offered by NICU CSW.  MOB was very friendly and appreciative of CSW's visit.  She states she has had gestational diabetes with all three of her pregnancies, but this is the first time she has experienced a NICU admission.  She is hopeful that baby will be able to discharge with her tomorrow and understands that baby is receiving needed care, but also thinks she is okay and doesn't know why she can't be with MOB.  She states she would like an update from the NNP today.  CSW told MOB that CSW will inform NNP of this.  She reports having everything she needs for baby at home.  She appears to be coping well. She states FOB's immigration paperwork is still in process. He is here now, but has a residence in [place].  They are hoping the process will be finalized soon and he can remain here permanently.  She reports good support system of family, friends and church members.  She states no issues with transportation if she has to discharge prior to baby. CSW informed her of the option to room in with baby the night before his expected discharge.  She is happy about this.  She states no questions or needs at this time.  CSW has no social concerns at this time.       VI SOCIAL WORK PLAN Social Work Plan Psychosocial Support/Ongoing Assessment of Needs  Type of pt/family education:   NICU rooming in information NICU CSW services  If child protective services report - county:   If child protective services report - date:   Information/referral to community resources comment:   No referral needs identified at this time.  Other social work plan:  

## 2012-03-06 NOTE — Progress Notes (Signed)
Post Partum Day 2 Subjective: up ad lib, voiding, tolerating PO, + flatus and HA persistent. Describes as athrobbing HA not positional. No prior hx of HA. Some relief with Percocet. Denies RUQ pain or visual disturbance. Baby sugars stable. Baby ready for stable and ok'd for discharge  Objective: Blood pressure 116/64, pulse 75, temperature 97.4 F (36.3 C), temperature source Oral, resp. rate 18, height 5' 7.5" (1.715 m), weight 233 lb (105.688 kg), SpO2 97.00%, unknown if currently breastfeeding.  Physical Exam:  General: alert and cooperative Lochia: appropriate Uterine Fundus: firm Incision: perineum intact DVT Evaluation: No evidence of DVT seen on physical exam.  No RUQ tenderness noted on exam Negative Homan's sign. No cords or calf tenderness. No significant calf/ankle edema. Dtr's 1+ no clonus   Basename 03/06/12 0520 03/05/12 0545  HGB 9.7* 8.8*  HCT 30.4* 27.5*    Assessment/Plan: Migranal HA Fioricet  LOS: 2 days   Lamarr Feenstra G 03/06/2012, 9:23 AM

## 2012-03-07 MED ORDER — ZOLPIDEM TARTRATE 5 MG PO TABS: 5.0000 mg | ORAL_TABLET | Freq: Every evening | ORAL | Status: DC | PRN

## 2012-03-07 MED ORDER — BUTALBITAL-APAP-CAFFEINE 50-325-40 MG PO TABS: 1.0000 | ORAL_TABLET | ORAL | Status: DC | PRN

## 2012-03-07 NOTE — Progress Notes (Signed)
Discharge instructions reviewed with patient and husband.  Both state understanding of instructions for mother and baby.  No home equipment needed.  Ambulated to car with staff without incident and discharged to home with husband.

## 2012-03-07 NOTE — Progress Notes (Signed)
Post Partum Day 3 Subjective: no complaints Headache resolved  Objective: Blood pressure 112/71, pulse 67, temperature 97.5 F (36.4 C), temperature source Oral, resp. rate 18, height 5' 0.75" (1.543 m), weight 105.688 kg (233 lb), SpO2 98.00%, unknown if currently breastfeeding.  Physical Exam:  General: alert and cooperative Lochia: appropriate Uterine Fundus: firm Incision: healing well DVT Evaluation: No evidence of DVT seen on physical exam.   Basename 03/06/12 0520 03/05/12 0545  HGB 9.7* 8.8*  HCT 30.4* 27.5*    Assessment/Plan: Discharge home Rx Fioricet and ambien   LOS: 3 days   [name] [name] 03/07/2012, 8:51 AM

## 2012-03-07 NOTE — Discharge Summary (Signed)
Obstetric Discharge Summary Reason for Admission: onset of labor Prenatal Procedures: none Intrapartum Procedures: spontaneous vaginal delivery Postpartum Procedures: none Complications-Operative and Postpartum: none Hemoglobin  Date Value Range Status  03/06/2012 9.7* 12.0 - 15.0 g/dL Final     HCT  Date Value Range Status  03/06/2012 30.4* 36.0 - 46.0 % Final    Physical Exam:  General: alert Lochia: appropriate Uterine Fundus: firm Incision: healing well DVT Evaluation: No evidence of DVT seen on physical exam.  Discharge Diagnoses: Term Pregnancy-delivered  Discharge Information: Date: 03/07/2012 Activity: pelvic rest Diet: routine Medications: fioricet and ambien Condition: improved Instructions: refer to practice specific booklet Discharge to: home   Newborn Data: Live born female  Birth Weight: 8 lb 4.1 oz (3745 g) APGAR: 8, 9  Home with mother.  Taher Vannote L 03/07/2012, 8:52 AM

## 2012-03-08 LAB — TYPE AND SCREEN
Antibody Screen: NEGATIVE
Unit division: 0

## 2012-03-09 NOTE — Progress Notes (Signed)
Post discharge review completed. 

## 2012-07-10 ENCOUNTER — Ambulatory Visit (INDEPENDENT_AMBULATORY_CARE_PROVIDER_SITE_OTHER): Payer: BC Managed Care – PPO | Admitting: Internal Medicine

## 2012-07-10 ENCOUNTER — Encounter: Payer: Self-pay | Admitting: *Deleted

## 2012-07-10 ENCOUNTER — Encounter: Payer: Self-pay | Admitting: Internal Medicine

## 2012-07-10 VITALS — BP 126/78 | HR 71 | Wt 221.4 lb

## 2012-07-10 DIAGNOSIS — R040 Epistaxis: Secondary | ICD-10-CM

## 2012-07-10 LAB — CBC WITH DIFFERENTIAL/PLATELET
Basophils Relative: 0.5 % (ref 0.0–3.0)
Eosinophils Relative: 3 % (ref 0.0–5.0)
Lymphocytes Relative: 40.1 % (ref 12.0–46.0)
Monocytes Relative: 7.8 % (ref 3.0–12.0)
Neutrophils Relative %: 48.6 % (ref 43.0–77.0)
RBC: 4.22 Mil/uL (ref 3.87–5.11)
WBC: 4.9 10*3/uL (ref 4.5–10.5)

## 2012-07-10 NOTE — Progress Notes (Signed)
  Subjective:    Patient ID: [name] [name], female    DOB: 1980-04-24, 32 y.o.   MRN: [id number]  HPI  Symptoms began 5/28 without specific trigger or injury. It was present upon awakening as mild epistaxis from R nare for past 3 days & last night. She does have a history of allergies for which she takes fluticasone nasal spray and Zyrtec on an as needed basis. She denies any recent flare of her allergies. She had not been using the fluticasone nasal spray prior to the onset of symptoms. Her only medications were prenatal vitamins which she is [place]. She specifically denies the intake of excess aspirin, nonsteroidals, or anticoagulants.    Review of Systems She denies  hemoptysis, hematuria, melena, or rectal bleeding.She has no unexplained weight loss, dysphagia, or abdominal pain. She has no abnormal bruising or bleeding.She has no difficulty stopping bleeding with injury. No PMH of HTN. She denies fever, chills, or sweats. She has no headache pain or discharge; frontal headache; facial pain; nasal purulence; sore throat, or dental pain. She has no cough, sputum, or wheezing.    Objective:   Physical Exam General appearance:good health ;well nourished; no acute distress or increased work of breathing is present.  No  lymphadenopathy about the head, neck, or axilla noted.   Eyes: No conjunctival inflammation or lid edema is present. There is no scleral icterus.  Ears:  External ear exam shows no significant lesions or deformities.  Otoscopic examination reveals clear canals, tympanic membranes are intact bilaterally without bulging, retraction, inflammation or discharge.  Nose:  External nasal examination shows no deformity or inflammation.  Slow active bleeding is noted in the right may or originating from the right septum.  Oral exam: Dental hygiene is good; lips and gums are healthy appearing.There is no oropharyngeal erythema or exudate noted.   Neck:  No deformities,  masses, or  tenderness noted.    Heart:  Normal rate and regular rhythm. S1 and S2 normal without gallop, murmur, click, rub or other extra sounds. S4  Lungs:Chest clear to auscultation; no wheezes, rhonchi,rales ,or rubs present.No increased work of breathing.    Abd: no organomegaly  Extremities:  No cyanosis, edema, or clubbing  noted    Skin: Warm & dry          Assessment & Plan:  #1 epistaxis, active from the right nare without historical or physical evidence of specific etiology.  Plan: CBC and differential  ENT evaluation today

## 2012-07-10 NOTE — Patient Instructions (Addendum)
IF Fluticasone employed in future ,use the "crossover" technique as discussed . Please review the record and make any corrections; share this with all medical staff seen.

## 2012-08-31 ENCOUNTER — Ambulatory Visit: Payer: BC Managed Care – PPO | Admitting: Internal Medicine

## 2012-08-31 DIAGNOSIS — Z0289 Encounter for other administrative examinations: Secondary | ICD-10-CM

## 2012-09-07 ENCOUNTER — Encounter: Payer: Self-pay | Admitting: Internal Medicine

## 2012-09-07 ENCOUNTER — Encounter: Payer: Self-pay | Admitting: *Deleted

## 2012-09-07 ENCOUNTER — Ambulatory Visit (INDEPENDENT_AMBULATORY_CARE_PROVIDER_SITE_OTHER): Payer: BC Managed Care – PPO | Admitting: Internal Medicine

## 2012-09-07 VITALS — BP 118/80 | HR 75 | Temp 98.1°F | Ht 67.0 in | Wt 211.6 lb

## 2012-09-07 DIAGNOSIS — Z0000 Encounter for general adult medical examination without abnormal findings: Secondary | ICD-10-CM

## 2012-09-07 NOTE — Patient Instructions (Addendum)
Next visit 1 year 

## 2012-09-07 NOTE — Progress Notes (Signed)
  Subjective:    Patient ID: [name] [name], female    DOB: [birth year], 32 y.o.   MRN: [id number]  HPI Complete physical exam  Past Medical History  Diagnosis Date  . Multiple allergies     chronic sneezing  . Uterine fibroid   . Gestational diabetes    Past Surgical History  Procedure Laterality Date  . Leg surgery      --R--from absecss with reconstructive flap- 2004  . Leg discrepancy     History   Social History  . Marital Status: Married    Spouse Name: N/A    Number of Children: 3  . Years of Education: N/A   Occupational History  . 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 Smoking status: Never Smoker   . Smokeless tobacco: Never Used  . Alcohol Use: No  . Drug Use: No  . Sexually Active: Not Currently   Other Topics Concern  . Not on file   Social History Narrative   Original from [place]    Family History  Problem Relation Age of Onset  . Diabetes Other     GM  . Coronary artery disease Neg Hx   . Colon cancer Neg Hx   . Breast cancer Neg Hx   . Stroke Neg Hx       Review of Systems Diet--was diagnosed with gestational diabetes pregnant, since the delivery, she has lost 25 pounds, eating healthy. Exercise--goes to the gym 3 times a week. Denies chest pain or shortness or breath No  nausea, vomiting, diarrhea blood in the stools. Was seen with epistaxis , referred to ENT, now doing well. Denies dysuria gross hematuria. She is still breast-feeding.      Objective:   Physical Exam BP 118/80  Pulse 75  Temp(Src) 98.1 F (36.7 C) (Oral)  Ht 5\' 7"  (1.702 m)  Wt 211 lb 9.6 oz (95.981 kg)  BMI 33.13 kg/m2  SpO2 99% General -- alert, well-developed, NAD.   Neck --no thyromegaly Lungs -- normal respiratory effort, no intercostal retractions, no accessory muscle use, and normal breath sounds.   Heart-- normal rate, regular rhythm, no murmur, and no gallop.   Abdomen--soft, non-tender, no distention, no masses, no HSM, no  guarding, and no rigidity.   Extremities-- no pretibial edema bilaterally  Neurologic-- alert & oriented X3 and strength normal in all extremities. Psych-- Cognition and judgment appear intact. Alert and cooperative with normal attention span and concentration.  not anxious appearing and not depressed appearing.       Assessment & Plan:

## 2012-09-07 NOTE — Assessment & Plan Note (Addendum)
Td 08-2010 Labs Diet-exercise -- seems to be doing well Female care per gyn Labs ("like all things screened"), see orders ; will also check a A1C d/t h/o gestational DM

## 2012-09-08 ENCOUNTER — Encounter: Payer: Self-pay | Admitting: *Deleted

## 2012-09-08 LAB — COMPREHENSIVE METABOLIC PANEL
ALT: 24 U/L (ref 0–35)
AST: 23 U/L (ref 0–37)
CO2: 27 mEq/L (ref 19–32)
Creatinine, Ser: 0.8 mg/dL (ref 0.4–1.2)
GFR: 103.85 mL/min (ref >60.00)
Total Bilirubin: 0.7 mg/dL (ref 0.3–1.2)

## 2012-09-08 LAB — LIPID PANEL
HDL: 42.9 mg/dL (ref >39.00)
LDL Cholesterol: 97 mg/dL (ref 0–99)
Total CHOL/HDL Ratio: 3
Triglycerides: 45 mg/dL (ref 0.0–149.0)
VLDL: 9 mg/dL (ref 0.0–40.0)

## 2012-09-08 LAB — HEPATITIS C ANTIBODY: HCV Ab: NEGATIVE

## 2012-09-08 LAB — HIV ANTIBODY (ROUTINE TESTING W REFLEX): HIV: NONREACTIVE

## 2012-09-10 ENCOUNTER — Encounter: Payer: Self-pay | Admitting: *Deleted

## 2013-01-26 LAB — OB RESULTS CONSOLE RPR: RPR: NONREACTIVE

## 2013-01-26 LAB — OB RESULTS CONSOLE HIV ANTIBODY (ROUTINE TESTING): HIV: NONREACTIVE

## 2013-01-26 LAB — OB RESULTS CONSOLE ABO/RH: RH Type: POSITIVE

## 2013-01-26 LAB — OB RESULTS CONSOLE GC/CHLAMYDIA
CHLAMYDIA, DNA PROBE: NEGATIVE
Gonorrhea: NEGATIVE

## 2013-01-26 LAB — OB RESULTS CONSOLE ANTIBODY SCREEN: Antibody Screen: NEGATIVE

## 2013-01-26 LAB — OB RESULTS CONSOLE HEPATITIS B SURFACE ANTIGEN: Hepatitis B Surface Ag: NEGATIVE

## 2013-01-26 LAB — OB RESULTS CONSOLE RUBELLA ANTIBODY, IGM: Rubella: IMMUNE

## 2013-05-12 LAB — OB RESULTS CONSOLE GBS: GBS: POSITIVE

## 2013-05-17 ENCOUNTER — Encounter (HOSPITAL_COMMUNITY): Payer: Self-pay | Admitting: *Deleted

## 2013-05-17 ENCOUNTER — Telehealth (HOSPITAL_COMMUNITY): Payer: Self-pay | Admitting: *Deleted

## 2013-05-17 NOTE — Telephone Encounter (Signed)
Preadmission screen  

## 2013-05-22 ENCOUNTER — Inpatient Hospital Stay (HOSPITAL_COMMUNITY): Admission: RE | Admit: 2013-05-22 | Payer: BC Managed Care – PPO | Source: Ambulatory Visit

## 2013-05-28 ENCOUNTER — Telehealth (HOSPITAL_COMMUNITY): Payer: Self-pay | Admitting: *Deleted

## 2013-05-28 ENCOUNTER — Encounter (HOSPITAL_COMMUNITY): Payer: Self-pay | Admitting: *Deleted

## 2013-05-28 NOTE — Telephone Encounter (Signed)
Preadmission screen  

## 2013-05-29 ENCOUNTER — Encounter (HOSPITAL_COMMUNITY): Payer: Self-pay | Admitting: *Deleted

## 2013-05-29 ENCOUNTER — Encounter (HOSPITAL_COMMUNITY): Payer: BC Managed Care – PPO | Admitting: Anesthesiology

## 2013-05-29 ENCOUNTER — Inpatient Hospital Stay (HOSPITAL_COMMUNITY): Admission: RE | Admit: 2013-05-29 | Payer: BC Managed Care – PPO | Source: Ambulatory Visit

## 2013-05-29 ENCOUNTER — Inpatient Hospital Stay (HOSPITAL_COMMUNITY)
Admission: RE | Admit: 2013-05-29 | Discharge: 2013-06-01 | DRG: 765 | Disposition: A | Discharge status: Home / Self Care | Payer: BC Managed Care – PPO | Source: Ambulatory Visit | Attending: Obstetrics and Gynecology | Admitting: Obstetrics and Gynecology

## 2013-05-29 ENCOUNTER — Encounter (HOSPITAL_COMMUNITY)
Admission: RE | Disposition: A | Discharge status: Home / Self Care | Payer: Self-pay | Source: Ambulatory Visit | Attending: Obstetrics and Gynecology

## 2013-05-29 ENCOUNTER — Inpatient Hospital Stay (HOSPITAL_COMMUNITY): Payer: BC Managed Care – PPO | Admitting: Anesthesiology

## 2013-05-29 DIAGNOSIS — D259 Leiomyoma of uterus, unspecified: Secondary | ICD-10-CM | POA: Diagnosis present

## 2013-05-29 DIAGNOSIS — O409XX0 Polyhydramnios, unspecified trimester, not applicable or unspecified: Secondary | ICD-10-CM | POA: Diagnosis present

## 2013-05-29 DIAGNOSIS — O99892 Other specified diseases and conditions complicating childbirth: Secondary | ICD-10-CM | POA: Diagnosis present

## 2013-05-29 DIAGNOSIS — D4959 Neoplasm of unspecified behavior of other genitourinary organ: Secondary | ICD-10-CM | POA: Diagnosis present

## 2013-05-29 DIAGNOSIS — O09529 Supervision of elderly multigravida, unspecified trimester: Secondary | ICD-10-CM | POA: Diagnosis present

## 2013-05-29 DIAGNOSIS — O99814 Abnormal glucose complicating childbirth: Principal | ICD-10-CM | POA: Diagnosis present

## 2013-05-29 DIAGNOSIS — O3660X0 Maternal care for excessive fetal growth, unspecified trimester, not applicable or unspecified: Secondary | ICD-10-CM | POA: Diagnosis present

## 2013-05-29 DIAGNOSIS — O9989 Other specified diseases and conditions complicating pregnancy, childbirth and the puerperium: Secondary | ICD-10-CM

## 2013-05-29 DIAGNOSIS — O34599 Maternal care for other abnormalities of gravid uterus, unspecified trimester: Secondary | ICD-10-CM | POA: Diagnosis present

## 2013-05-29 DIAGNOSIS — D573 Sickle-cell trait: Secondary | ICD-10-CM | POA: Diagnosis present

## 2013-05-29 DIAGNOSIS — O9902 Anemia complicating childbirth: Secondary | ICD-10-CM | POA: Diagnosis present

## 2013-05-29 DIAGNOSIS — Z6837 Body mass index (BMI) 37.0-37.9, adult: Secondary | ICD-10-CM

## 2013-05-29 DIAGNOSIS — O99214 Obesity complicating childbirth: Secondary | ICD-10-CM

## 2013-05-29 DIAGNOSIS — O0940 Supervision of pregnancy with grand multiparity, unspecified trimester: Secondary | ICD-10-CM

## 2013-05-29 DIAGNOSIS — E669 Obesity, unspecified: Secondary | ICD-10-CM | POA: Diagnosis present

## 2013-05-29 DIAGNOSIS — O3410 Maternal care for benign tumor of corpus uteri, unspecified trimester: Secondary | ICD-10-CM

## 2013-05-29 DIAGNOSIS — Z302 Encounter for sterilization: Secondary | ICD-10-CM

## 2013-05-29 DIAGNOSIS — Z22330 Carrier of Group B streptococcus: Secondary | ICD-10-CM

## 2013-05-29 LAB — CBC
HEMATOCRIT: 36.6 % (ref 36.0–46.0)
HEMOGLOBIN: 12.4 g/dL (ref 12.0–15.0)
MCH: 31.2 pg (ref 26.0–34.0)
MCHC: 33.9 g/dL (ref 30.0–36.0)
MCV: 92 fL (ref 78.0–100.0)
PLATELETS: 159 10*3/uL (ref 150–400)
RBC: 3.98 MIL/uL (ref 3.87–5.11)
RDW: 15 % (ref 11.5–15.5)
WBC: 6.5 10*3/uL (ref 4.0–10.5)

## 2013-05-29 LAB — GLUCOSE, CAPILLARY
GLUCOSE-CAPILLARY: 105 mg/dL — AB (ref 70–99)
Glucose-Capillary: 107 mg/dL — ABNORMAL HIGH (ref 70–99)

## 2013-05-29 LAB — TYPE AND SCREEN
ABO/RH(D): B POS
Antibody Screen: NEGATIVE

## 2013-05-29 LAB — RPR

## 2013-05-29 SURGERY — Surgical Case
Anesthesia: Spinal | Laterality: Bilateral

## 2013-05-29 MED ORDER — TETANUS-DIPHTH-ACELL PERTUSSIS 5-2.5-18.5 LF-MCG/0.5 IM SUSP: 0.5000 mL | Freq: Once | INTRAMUSCULAR | Status: DC

## 2013-05-29 MED ORDER — LACTATED RINGERS IV SOLN
INTRAVENOUS | Status: DC
  Administered 2013-05-29 (×2): via INTRAVENOUS

## 2013-05-29 MED ORDER — ZOLPIDEM TARTRATE 5 MG PO TABS: 5.0000 mg | ORAL_TABLET | Freq: Every evening | ORAL | Status: DC | PRN

## 2013-05-29 MED ORDER — DIBUCAINE 1 % RE OINT: 1.0000 "application " | TOPICAL_OINTMENT | RECTAL | Status: DC | PRN

## 2013-05-29 MED ORDER — ONDANSETRON HCL 4 MG/2ML IJ SOLN
INTRAMUSCULAR | Status: AC
  Filled 2013-05-29: qty 2

## 2013-05-29 MED ORDER — HYDROMORPHONE HCL PF 1 MG/ML IJ SOLN: 0.2500 mg | INTRAMUSCULAR | Status: DC | PRN

## 2013-05-29 MED ORDER — OXYCODONE-ACETAMINOPHEN 5-325 MG PO TABS
1.0000 | ORAL_TABLET | ORAL | Status: DC | PRN
  Administered 2013-05-30: 1 via ORAL
  Administered 2013-05-30 (×2): 2 via ORAL
  Administered 2013-05-30: 1 via ORAL
  Administered 2013-05-30: 2 via ORAL
  Administered 2013-05-30: 1 via ORAL
  Administered 2013-05-31 – 2013-06-01 (×7): 2 via ORAL
  Filled 2013-05-29: qty 2
  Filled 2013-05-29: qty 1
  Filled 2013-05-29 (×4): qty 2
  Filled 2013-05-29: qty 1
  Filled 2013-05-29 (×4): qty 2
  Filled 2013-05-29: qty 1
  Filled 2013-05-29: qty 2

## 2013-05-29 MED ORDER — PHENYLEPHRINE 40 MCG/ML (10ML) SYRINGE FOR IV PUSH (FOR BLOOD PRESSURE SUPPORT)
PREFILLED_SYRINGE | INTRAVENOUS | Status: AC
  Filled 2013-05-29: qty 5

## 2013-05-29 MED ORDER — SIMETHICONE 80 MG PO CHEW: 80.0000 mg | CHEWABLE_TABLET | ORAL | Status: DC | PRN

## 2013-05-29 MED ORDER — SODIUM CHLORIDE 0.9 % IJ SOLN: 3.0000 mL | INTRAMUSCULAR | Status: DC | PRN

## 2013-05-29 MED ORDER — PHENYLEPHRINE 8 MG IN D5W 100 ML (0.08MG/ML) PREMIX OPTIME
INJECTION | INTRAVENOUS | Status: AC
  Filled 2013-05-29: qty 100

## 2013-05-29 MED ORDER — NALBUPHINE HCL 10 MG/ML IJ SOLN: 5.0000 mg | INTRAMUSCULAR | Status: DC | PRN

## 2013-05-29 MED ORDER — SCOPOLAMINE 1 MG/3DAYS TD PT72
1.0000 | MEDICATED_PATCH | Freq: Once | TRANSDERMAL | Status: AC
  Administered 2013-05-29: 1.5 mg via TRANSDERMAL

## 2013-05-29 MED ORDER — NALOXONE HCL 1 MG/ML IJ SOLN
1.0000 ug/kg/h | INTRAVENOUS | Status: DC | PRN
  Filled 2013-05-29: qty 2

## 2013-05-29 MED ORDER — ONDANSETRON HCL 4 MG/2ML IJ SOLN: 4.0000 mg | INTRAMUSCULAR | Status: DC | PRN

## 2013-05-29 MED ORDER — DIPHENHYDRAMINE HCL 50 MG/ML IJ SOLN: 12.5000 mg | INTRAMUSCULAR | Status: DC | PRN

## 2013-05-29 MED ORDER — KETOROLAC TROMETHAMINE 30 MG/ML IJ SOLN: 30.0000 mg | Freq: Four times a day (QID) | INTRAMUSCULAR | Status: AC | PRN

## 2013-05-29 MED ORDER — ONDANSETRON HCL 4 MG/2ML IJ SOLN
INTRAMUSCULAR | Status: DC | PRN
  Administered 2013-05-29: 4 mg via INTRAVENOUS

## 2013-05-29 MED ORDER — KETOROLAC TROMETHAMINE 60 MG/2ML IM SOLN
INTRAMUSCULAR | Status: AC
  Administered 2013-05-29: 60 mg via INTRAMUSCULAR
  Filled 2013-05-29: qty 2

## 2013-05-29 MED ORDER — MORPHINE SULFATE 0.5 MG/ML IJ SOLN
INTRAMUSCULAR | Status: AC
  Filled 2013-05-29: qty 10

## 2013-05-29 MED ORDER — METOCLOPRAMIDE HCL 5 MG/ML IJ SOLN: 10.0000 mg | Freq: Three times a day (TID) | INTRAMUSCULAR | Status: DC | PRN

## 2013-05-29 MED ORDER — PHENYLEPHRINE HCL 10 MG/ML IJ SOLN
INTRAMUSCULAR | Status: DC | PRN
  Administered 2013-05-29: 30 ug via INTRAVENOUS
  Administered 2013-05-29: 80 ug via INTRAVENOUS
  Administered 2013-05-29: 20 ug via INTRAVENOUS
  Administered 2013-05-29: 80 ug via INTRAVENOUS

## 2013-05-29 MED ORDER — OXYTOCIN 40 UNITS IN LACTATED RINGERS INFUSION - SIMPLE MED: 62.5000 mL/h | INTRAVENOUS | Status: AC

## 2013-05-29 MED ORDER — OXYTOCIN 40 UNITS IN LACTATED RINGERS INFUSION - SIMPLE MED
INTRAVENOUS | Status: DC | PRN
  Administered 2013-05-29: 40 [IU] via INTRAVENOUS

## 2013-05-29 MED ORDER — LANOLIN HYDROUS EX OINT
1.0000 | TOPICAL_OINTMENT | CUTANEOUS | Status: DC | PRN
Start: 2013-05-29 — End: 2013-06-01

## 2013-05-29 MED ORDER — WITCH HAZEL-GLYCERIN EX PADS: 1.0000 "application " | MEDICATED_PAD | CUTANEOUS | Status: DC | PRN

## 2013-05-29 MED ORDER — FAMOTIDINE IN NACL 20-0.9 MG/50ML-% IV SOLN
20.0000 mg | Freq: Once | INTRAVENOUS | Status: AC
  Administered 2013-05-29: 20 mg via INTRAVENOUS
  Filled 2013-05-29: qty 50

## 2013-05-29 MED ORDER — GENTAMICIN SULFATE 40 MG/ML IJ SOLN: INTRAVENOUS | Status: DC

## 2013-05-29 MED ORDER — SIMETHICONE 80 MG PO CHEW
80.0000 mg | CHEWABLE_TABLET | ORAL | Status: DC
  Administered 2013-05-30 – 2013-05-31 (×3): 80 mg via ORAL
  Filled 2013-05-29 (×3): qty 1

## 2013-05-29 MED ORDER — FENTANYL CITRATE 0.05 MG/ML IJ SOLN
INTRAMUSCULAR | Status: AC
Start: 2013-05-29 — End: 2013-05-29
  Filled 2013-05-29: qty 2

## 2013-05-29 MED ORDER — NALBUPHINE HCL 10 MG/ML IJ SOLN
5.0000 mg | INTRAMUSCULAR | Status: DC | PRN
  Administered 2013-05-30 (×2): 10 mg via INTRAVENOUS
  Filled 2013-05-29 (×2): qty 1

## 2013-05-29 MED ORDER — PROMETHAZINE HCL 25 MG/ML IJ SOLN: 6.2500 mg | INTRAMUSCULAR | Status: DC | PRN

## 2013-05-29 MED ORDER — KETOROLAC TROMETHAMINE 60 MG/2ML IM SOLN
60.0000 mg | Freq: Once | INTRAMUSCULAR | Status: AC | PRN
  Administered 2013-05-29: 60 mg via INTRAMUSCULAR

## 2013-05-29 MED ORDER — LACTATED RINGERS IV SOLN
INTRAVENOUS | Status: DC | PRN
  Administered 2013-05-29: 10:00:00 via INTRAVENOUS

## 2013-05-29 MED ORDER — OXYTOCIN 10 UNIT/ML IJ SOLN
INTRAMUSCULAR | Status: AC
  Filled 2013-05-29: qty 4

## 2013-05-29 MED ORDER — NALOXONE HCL 0.4 MG/ML IJ SOLN: 0.4000 mg | INTRAMUSCULAR | Status: DC | PRN

## 2013-05-29 MED ORDER — SENNOSIDES-DOCUSATE SODIUM 8.6-50 MG PO TABS
2.0000 | ORAL_TABLET | ORAL | Status: DC
  Administered 2013-05-30 – 2013-05-31 (×3): 2 via ORAL
  Filled 2013-05-29 (×3): qty 2

## 2013-05-29 MED ORDER — MEPERIDINE HCL 25 MG/ML IJ SOLN: 6.2500 mg | INTRAMUSCULAR | Status: DC | PRN

## 2013-05-29 MED ORDER — LACTATED RINGERS IV SOLN
INTRAVENOUS | Status: DC | PRN
  Administered 2013-05-29 (×4): via INTRAVENOUS

## 2013-05-29 MED ORDER — DEXTROSE 5 % IV SOLN
INTRAVENOUS | Status: AC
  Administered 2013-05-29: 116.25 mL via INTRAVENOUS
  Filled 2013-05-29: qty 10.25

## 2013-05-29 MED ORDER — DIPHENHYDRAMINE HCL 25 MG PO CAPS
25.0000 mg | ORAL_CAPSULE | Freq: Four times a day (QID) | ORAL | Status: DC | PRN
  Administered 2013-05-29 (×2): 25 mg via ORAL
  Filled 2013-05-29 (×2): qty 1

## 2013-05-29 MED ORDER — SCOPOLAMINE 1 MG/3DAYS TD PT72
MEDICATED_PATCH | TRANSDERMAL | Status: AC
  Administered 2013-05-29: 1.5 mg via TRANSDERMAL
  Filled 2013-05-29: qty 1

## 2013-05-29 MED ORDER — CITRIC ACID-SODIUM CITRATE 334-500 MG/5ML PO SOLN
30.0000 mL | Freq: Once | ORAL | Status: AC
  Administered 2013-05-29: 30 mL via ORAL
  Filled 2013-05-29: qty 15

## 2013-05-29 MED ORDER — MORPHINE SULFATE (PF) 0.5 MG/ML IJ SOLN
INTRAMUSCULAR | Status: DC | PRN
  Administered 2013-05-29: .15 mg via EPIDURAL

## 2013-05-29 MED ORDER — MENTHOL 3 MG MT LOZG
1.0000 | LOZENGE | OROMUCOSAL | Status: DC | PRN
Start: 2013-05-29 — End: 2013-06-01

## 2013-05-29 MED ORDER — IBUPROFEN 600 MG PO TABS
600.0000 mg | ORAL_TABLET | Freq: Four times a day (QID) | ORAL | Status: DC
  Administered 2013-05-29 – 2013-06-01 (×11): 600 mg via ORAL
  Filled 2013-05-29 (×11): qty 1

## 2013-05-29 MED ORDER — SIMETHICONE 80 MG PO CHEW
80.0000 mg | CHEWABLE_TABLET | Freq: Three times a day (TID) | ORAL | Status: DC
Start: 2013-05-29 — End: 2013-06-01
  Administered 2013-05-29 – 2013-06-01 (×5): 80 mg via ORAL
  Filled 2013-05-29 (×6): qty 1

## 2013-05-29 MED ORDER — FENTANYL CITRATE 0.05 MG/ML IJ SOLN
INTRAMUSCULAR | Status: DC | PRN
  Administered 2013-05-29: 15 ug via INTRAVENOUS

## 2013-05-29 MED ORDER — ONDANSETRON HCL 4 MG/2ML IJ SOLN: 4.0000 mg | Freq: Three times a day (TID) | INTRAMUSCULAR | Status: DC | PRN

## 2013-05-29 MED ORDER — DIPHENHYDRAMINE HCL 50 MG/ML IJ SOLN: 25.0000 mg | INTRAMUSCULAR | Status: DC | PRN

## 2013-05-29 MED ORDER — PRENATAL MULTIVITAMIN CH
1.0000 | ORAL_TABLET | Freq: Every day | ORAL | Status: DC
  Administered 2013-05-30 – 2013-05-31 (×2): 1 via ORAL
  Filled 2013-05-29 (×2): qty 1

## 2013-05-29 MED ORDER — ONDANSETRON HCL 4 MG PO TABS: 4.0000 mg | ORAL_TABLET | ORAL | Status: DC | PRN

## 2013-05-29 MED ORDER — DIPHENHYDRAMINE HCL 25 MG PO CAPS: 25.0000 mg | ORAL_CAPSULE | ORAL | Status: DC | PRN

## 2013-05-29 MED ORDER — PHENYLEPHRINE 8 MG IN D5W 100 ML (0.08MG/ML) PREMIX OPTIME
INJECTION | INTRAVENOUS | Status: DC | PRN
  Administered 2013-05-29: 60 ug/min via INTRAVENOUS

## 2013-05-29 SURGICAL SUPPLY — 28 items
CLAMP CORD UMBIL (MISCELLANEOUS) IMPLANT
CLIP FILSHIE TUBAL LIGA STRL (Clip) ×2 IMPLANT
CLOTH BEACON ORANGE TIMEOUT ST (SAFETY) ×2 IMPLANT
DRAPE LG THREE QUARTER DISP (DRAPES) IMPLANT
DRSG OPSITE POSTOP 4X10 (GAUZE/BANDAGES/DRESSINGS) ×2 IMPLANT
DURAPREP 26ML APPLICATOR (WOUND CARE) ×2 IMPLANT
ELECT REM PT RETURN 9FT ADLT (ELECTROSURGICAL) ×2
ELECTRODE REM PT RTRN 9FT ADLT (ELECTROSURGICAL) ×1 IMPLANT
EXTRACTOR VACUUM M CUP 4 TUBE (SUCTIONS) IMPLANT
GLOVE SURG ORTHO 8.0 STRL STRW (GLOVE) ×2 IMPLANT
GOWN STRL NON-REIN LRG LVL3 (GOWN DISPOSABLE) ×2 IMPLANT
GOWN STRL REUS W/TWL LRG LVL3 (GOWN DISPOSABLE) ×4 IMPLANT
KIT ABG SYR 3ML LUER SLIP (SYRINGE) ×2 IMPLANT
NEEDLE HYPO 25X5/8 SAFETYGLIDE (NEEDLE) ×2 IMPLANT
NS IRRIG 1000ML POUR BTL (IV SOLUTION) ×2 IMPLANT
PACK C SECTION WH (CUSTOM PROCEDURE TRAY) ×2 IMPLANT
PAD OB MATERNITY 4.3X12.25 (PERSONAL CARE ITEMS) ×2 IMPLANT
RTRCTR C-SECT PINK 25CM LRG (MISCELLANEOUS) ×2 IMPLANT
STAPLER VISISTAT 35W (STAPLE) ×2 IMPLANT
SUT MNCRL 0 VIOLET CTX 36 (SUTURE) ×4 IMPLANT
SUT MONOCRYL 0 CTX 36 (SUTURE) ×4
SUT PDS AB 1 CT  36 (SUTURE)
SUT PDS AB 1 CT 36 (SUTURE) IMPLANT
SUT VIC AB 1 CTX 36 (SUTURE)
SUT VIC AB 1 CTX36XBRD ANBCTRL (SUTURE) IMPLANT
TOWEL OR 17X24 6PK STRL BLUE (TOWEL DISPOSABLE) ×2 IMPLANT
TRAY FOLEY CATH 14FR (SET/KITS/TRAYS/PACK) ×2 IMPLANT
WATER STERILE IRR 1000ML POUR (IV SOLUTION) ×2 IMPLANT

## 2013-05-29 NOTE — Op Note (Signed)
Cesarean Section Procedure Note  Pre-operative Diagnosis: IUP at 38 weeks, Poorly controlled noncompliant Gest DM, macrosomia, desires sterility  Post-operative Diagnosis: same  Surgeon: Luz Lex   Assistants: none  Anesthesia:spinal  Procedure:  Low Segment Transverse cesarean section  Procedure Details  The patient was seen in the Holding Room. The risks, benefits, complications, treatment options, and expected outcomes were discussed with the patient.  The patient concurred with the proposed plan, giving informed consent.  The site of surgery properly noted/marked.. A Time Out was held and the above information confirmed.  After induction of anesthesia, the patient was draped and prepped in the usual sterile manner. A Pfannenstiel incision was made and carried down through the subcutaneous tissue to the fascia. Fascial incision was made and extended transversely. The fascia was separated from the underlying rectus tissue superiorly and inferiorly. The peritoneum was identified and entered. Peritoneal incision was extended longitudinally. The utero-vesical peritoneal reflection was incised transversely and the bladder flap was bluntly freed from the lower uterine segment. A low transverse uterine incision was made. Delivered from vertex presentation was a baby with Apgar scores of 9 at one minute and 9 at five minutes. After the umbilical cord was clamped and cut cord blood was obtained for evaluation. The placenta was removed intact and appeared normal. The uterine outline, tubes and ovaries appeared normal. The uterine incision was closed with running locked sutures of 0 monocryl and imbricated with 0 monocryl. Hemostasis was observed. Lavage was carried out until clear.The fallopian tubes were identified by their fimbriated ends and filshie clips were placed across the tubes at the midportion of each tube.  Good placement was noted bilaterally.  The peritoneum was then closed with 0  monocryl and rectus muscles plicated in the midline.  After hemostasis was assured, the fascia was then reapproximated with running sutures of 0 PDS. Irrigation was applied and after adequate hemostasis was assured, the skin was reapproximated with staples.  Instrument, sponge, and needle counts were correct prior the abdominal closure and at the conclusion of the case. The patient received clinamycin and gentamicin preoperatively.  Findings: Viable female, enlarged uterus with multiple leiomyomas  Estimated Blood Loss:  1200cc         Specimens: Placenta was sent to L&D         Complications:  None

## 2013-05-29 NOTE — Anesthesia Postprocedure Evaluation (Signed)
Anesthesia Post Note  Patient: [name] [name]  Procedure(s) Performed: Procedure(s) (LRB): CESAREAN SECTION WITH BILATERAL TUBAL LIGATION (Bilateral)  Anesthesia type: Spinal  Patient location: PACU  Post pain: Pain level controlled  Post assessment: Post-op Vital signs reviewed  Last Vitals: BP 93/66  Pulse 80  Temp(Src) 36.7 C (Oral)  Resp 20  Ht 5' 7.5" (1.715 m)  Wt 241 lb (109.317 kg)  BMI 37.17 kg/m2  SpO2 97%  Breastfeeding? Unknown  Post vital signs: Reviewed  Level of consciousness: sedated  Complications: No apparent anesthesia complications

## 2013-05-29 NOTE — H&P (Signed)
[name] [name] is a 33 y.o. female presenting for primary cesarean section due to poorly controlled gestational diabetes and fetal macrsomia.  Pt presented late to our office at [redacted]weeks gestational age, declined ama genetic testing, declined flu vax.  History of gest diabetes in previous pregancies so began glc monitoring instead of glucola testing.   Several visits did not bring her levels but by 29 weeks was clear that they were elevated with Fasting glc levels of 110 but PP levels 120.  Began on glyburide (had HgA1c of 6.1) and required increase to 7.5mg  q HS but still had FBS 95-104 with PP in the 120 range.  Polyhydraminos and Macrosomia noted on US's.    Pt was noncompliant with the meds and diet.  Dr Gaetano Net had a discussion the Dr Burnett Harry (MFM) regarding her care at 37 weeks and had recommended delivery due to her elevated sugars and noncompliance.  Pt wanted to wait until at least 38 weeks and was scheduled for induction of labor, however, Ultrasound yesterday shows EFW of 9+6lbs.  I discussed increased risk of shoulder dystocia due to macrosomia and diabetes and at this time recomemd C/S.  SHe also desires BTL.  GBS+ and sickle trait.Marland Kitchen History OB History   Grav Para Term Preterm Abortions TAB SAB Ect Mult Living   4 3 3  0 0 0 0 0 0 3     Past Medical History  Diagnosis Date  . Multiple allergies     chronic sneezing  . Uterine fibroid   . Gestational diabetes   . Vaginal Pap smear, abnormal   . AMA (advanced maternal age) multigravida 78+   . Sickle cell trait    Past Surgical History  Procedure Laterality Date  . Leg surgery      --R--from absecss with reconstructive flap- 2004  . Leg discrepancy     Family History: family history includes Diabetes in her other. There is no history of Coronary artery disease, Colon cancer, Breast cancer, or Stroke. Social History:  reports that she has never smoked. She has never used smokeless tobacco. She reports that she does not drink alcohol  or use illicit drugs.   Prenatal Transfer Tool  Maternal Diabetes: Yes:  Diabetes Type:  Insulin/Medication controlled Genetic Screening: Declined Maternal Ultrasounds/Referrals: Normal Fetal Ultrasounds or other Referrals:  Other: Polyhydraminos Maternal Substance Abuse:  No Significant Maternal Medications:  Meds include: Other: gliburide Significant Maternal Lab Results:  None Other Comments:  None  ROS    There were no vitals taken for this visit. Exam Physical Exam   Cx 3/50/high Prenatal labs: ABO, Rh: B/Positive/-- (12/16 0000) Antibody: Negative (12/16 0000) Rubella: Immune (12/16 0000) RPR: Nonreactive (12/16 0000)  HBsAg: Negative (12/16 0000)  HIV: Non-reactive (12/16 0000)  GBS: Positive (04/01 0000)   Assessment/Plan: IUP at 38 weeks A2DM with noncompliance with meds and diet, and poorly controlled glc levels MFM recommends delivery Fetal Macrosomia - recommend C/S Multiparity desires BTL Risks and benefits of C/S were discussed.  All questions were answered and informed consent was obtained.  Plan to proceed with low segment transverse Cesarean Section. Multiparity and desires sterility.  Discussed risks and benefits of sterilization including but not limited to risk of tubal failure quoted as 06/998.  She gives her informed consent.    Luz Lex 05/29/2013, 6:56 AM

## 2013-05-29 NOTE — Anesthesia Procedure Notes (Signed)
Spinal  Patient location during procedure: OR Start time: 05/29/2013 9:46 AM End time: 05/29/2013 9:48 AM Staffing Anesthesiologist: Nolon Nations R Performed by: anesthesiologist  Preanesthetic Checklist Completed: patient identified, site marked, surgical consent, pre-op evaluation, timeout performed, IV checked, risks and benefits discussed and monitors and equipment checked Spinal Block Patient position: sitting Prep: DuraPrep Patient monitoring: heart rate, continuous pulse ox and blood pressure Approach: midline Location: L3-4 Injection technique: single-shot Needle Needle type: Sprotte  Needle gauge: 24 G Needle length: 9 cm Assessment Sensory level: T6 Additional Notes Expiration date of kit checked and confirmed. Patient tolerated procedure well, without complications.

## 2013-05-29 NOTE — Anesthesia Preprocedure Evaluation (Signed)
Anesthesia Evaluation  Patient identified by MRN, date of birth, ID band Patient awake    Reviewed: Allergy & Precautions, H&P , Patient's Chart, lab work & pertinent test results  Airway Mallampati: II TM Distance: >3 FB Neck ROM: full    Dental no notable dental hx.    Pulmonary  breath sounds clear to auscultation  Pulmonary exam normal       Cardiovascular Exercise Tolerance: Good Rhythm:regular Rate:Normal     Neuro/Psych    GI/Hepatic   Endo/Other  diabetesMorbid obesity  Renal/GU      Musculoskeletal   Abdominal   Peds  Hematology   Anesthesia Other Findings   Reproductive/Obstetrics                           Anesthesia Physical  Anesthesia Plan  ASA: III  Anesthesia Plan: Spinal   Post-op Pain Management:    Induction:   Airway Management Planned:   Additional Equipment:   Intra-op Plan:   Post-operative Plan:   Informed Consent: I have reviewed the patients History and Physical, chart, labs and discussed the procedure including the risks, benefits and alternatives for the proposed anesthesia with the patient or authorized representative who has indicated his/her understanding and acceptance.   Dental advisory given  Plan Discussed with: CRNA  Anesthesia Plan Comments: ( )        Anesthesia Quick Evaluation

## 2013-05-29 NOTE — Transfer of Care (Signed)
Immediate Anesthesia Transfer of Care Note  Patient: [name] [name]  Procedure(s) Performed: Procedure(s) with comments: CESAREAN SECTION WITH BILATERAL TUBAL LIGATION (Bilateral) - PRIMARY [facility] 4/30  Patient Location: PACU  Anesthesia Type:Spinal  Level of Consciousness: awake, alert  and oriented  Airway & Oxygen Therapy: Patient Spontanous Breathing  Post-op Assessment: Report given to PACU RN  Post vital signs: Reviewed and stable  Complications: No apparent anesthesia complications

## 2013-05-30 LAB — CBC
HCT: 30.5 % — ABNORMAL LOW (ref 36.0–46.0)
Hemoglobin: 10.2 g/dL — ABNORMAL LOW (ref 12.0–15.0)
MCH: 30.7 pg (ref 26.0–34.0)
MCHC: 33.4 g/dL (ref 30.0–36.0)
MCV: 91.9 fL (ref 78.0–100.0)
PLATELETS: 131 10*3/uL — AB (ref 150–400)
RBC: 3.32 MIL/uL — AB (ref 3.87–5.11)
RDW: 15 % (ref 11.5–15.5)
WBC: 7.8 10*3/uL (ref 4.0–10.5)

## 2013-05-30 LAB — GLUCOSE, RANDOM: GLUCOSE: 119 mg/dL — AB (ref 70–99)

## 2013-05-30 NOTE — Progress Notes (Signed)
Subjective: Postpartum Day 1: Cesarean Delivery Patient reports + flatus, + BM and no problems voiding.    Objective: Vital signs in last 24 hours: Temp:  [97.4 F (36.3 C)-98.5 F (36.9 C)] 98.5 F (36.9 C) (04/19 0515) Pulse Rate:  [65-102] 83 (04/19 0515) Resp:  [14-24] 20 (04/19 0515) BP: (88-108)/(47-71) 88/61 mmHg (04/19 0515) SpO2:  [94 %-100 %] 100 % (04/19 0515)  Physical Exam:  General: alert, cooperative, appears stated age and no distress Lochia: appropriate Uterine Fundus: firm Incision: healing well DVT Evaluation: No evidence of DVT seen on physical exam.   Recent Labs  05/29/13 0755 05/30/13 0610  HGB 12.4 10.2*  HCT 36.6 30.5*    Assessment/Plan: Status post Cesarean section. Doing well postoperatively. C/S and BTL.  Now off Glyburide.  Baby for circ today Continue current care.  Luz Lex 05/30/2013, 10:41 AM

## 2013-05-30 NOTE — Addendum Note (Signed)
Addendum created 05/30/13 0825 by Asher Muir, CRNA   Modules edited: Charges VN, Notes Section   Notes Section:  File: [id number]

## 2013-05-30 NOTE — Anesthesia Postprocedure Evaluation (Signed)
Anesthesia Post Note  Patient: [name] [name]  Procedure(s) Performed: Procedure(s) (LRB): CESAREAN SECTION WITH BILATERAL TUBAL LIGATION (Bilateral)  Anesthesia type: Spinal  Patient location: Mother/Baby  Post pain: Pain level controlled  Post assessment: Post-op Vital signs reviewed  Last Vitals:  Filed Vitals:   05/30/13 0515  BP: 88/61  Pulse: 83  Temp: 36.9 C  Resp: 20    Post vital signs: Reviewed  Level of consciousness: awake  Complications: No apparent anesthesia complications

## 2013-05-31 ENCOUNTER — Encounter (HOSPITAL_COMMUNITY): Payer: Self-pay | Admitting: Obstetrics and Gynecology

## 2013-05-31 NOTE — Lactation Note (Signed)
This note was copied from the chart of [place]. Lactation Consultation Note Encouraged mother to bring baby to her, assisted with pillows and fb hold and compression for a deeper latch.  Encouraged mother to call for further assistance.  Patient Name: [name] [name] [id number] Date: 05/31/2013 Reason for consult: Follow-up assessment   Maternal Data Has patient been taught Hand Expression?: Yes  Feeding Feeding Type: Breast Fed Length of feed: 45 min  LATCH Score/Interventions Latch: Repeated attempts needed to sustain latch, nipple held in mouth throughout feeding, stimulation needed to elicit sucking reflex. Intervention(s): Assist with latch;Adjust position  Audible Swallowing: Spontaneous and intermittent  Type of Nipple: Everted at rest and after stimulation  Comfort (Breast/Nipple): Soft / non-tender     Hold (Positioning): Assistance needed to correctly position infant at breast and maintain latch.  LATCH Score: 8  Lactation Tools Discussed/Used     Consult Status Consult Status: Follow-up Date: 06/01/13 Follow-up type: In-patient    Larry Sierras Lynnette Pote 05/31/2013, 4:45 PM

## 2013-05-31 NOTE — Progress Notes (Signed)
Subjective: Postpartum Day 2: Cesarean Delivery Patient reports tolerating PO, + flatus and no problems voiding.    Objective: Vital signs in last 24 hours: Temp:  [97.9 F (36.6 C)-98.3 F (36.8 C)] 97.9 F (36.6 C) (04/20 0610) Pulse Rate:  [87-99] 87 (04/20 0610) Resp:  [18-20] 18 (04/20 0610) BP: (101-133)/(66-88) 101/66 mmHg (04/20 0610) SpO2:  [100 %] 100 % (04/19 0915)  Physical Exam:  General: alert and cooperative Lochia: appropriate Uterine Fundus: firm Incision: healing well, honeycomb dressing with small old drainage noted on bandage DVT Evaluation: No evidence of DVT seen on physical exam. Negative Homan's sign. No cords or calf tenderness. No significant calf/ankle edema.   Recent Labs  05/29/13 0755 05/30/13 0610  HGB 12.4 10.2*  HCT 36.6 30.5*  BS- 105-107 mg /dl  Assessment/Plan: Status post Cesarean section. Postoperative course complicated by gestational DM  Continue current care.  Damien Fusi 05/31/2013, 8:45 AM

## 2013-06-01 MED ORDER — OXYCODONE-ACETAMINOPHEN 5-325 MG PO TABS: 1.0000 | ORAL_TABLET | ORAL | Status: DC | PRN

## 2013-06-01 MED ORDER — IBUPROFEN 600 MG PO TABS: 600.0000 mg | ORAL_TABLET | Freq: Four times a day (QID) | ORAL | Status: DC

## 2013-06-01 NOTE — Discharge Summary (Signed)
Obstetric Discharge Summary Reason for Admission: cesarean section Prenatal Procedures: NST and ultrasound Intrapartum Procedures: cesarean: low cervical, transverse Postpartum Procedures: none Complications-Operative and Postpartum: none Hemoglobin  Date Value Ref Range Status  05/30/2013 10.2* 12.0 - 15.0 g/dL Final     DELTA CHECK NOTED     REPEATED TO VERIFY     HCT  Date Value Ref Range Status  05/30/2013 30.5* 36.0 - 46.0 % Final    Physical Exam:  General: alert and cooperative Lochia: appropriate Uterine Fundus: firm Incision: healing well, staples intact DVT Evaluation: No evidence of DVT seen on physical exam. Negative Homan's sign. No cords or calf tenderness. Calf/Ankle edema is present.  Discharge Diagnoses: Term Pregnancy-delivered and BTL  Discharge Information: Date: 06/01/2013 Activity: pelvic rest Diet: routine Medications: PNV, Ibuprofen and Percocet Condition: stable Instructions: refer to practice specific booklet Discharge to: home   Newborn Data: Live born female  Birth Weight: 10 lb 0.1 oz (4540 g) APGAR: 9, 9  Home with mother.  Damien Fusi 06/01/2013, 8:09 AM

## 2013-06-10 ENCOUNTER — Inpatient Hospital Stay (HOSPITAL_COMMUNITY)
Admission: AD | Admit: 2013-06-10 | Payer: BC Managed Care – PPO | Source: Ambulatory Visit | Admitting: Obstetrics and Gynecology

## 2013-08-28 ENCOUNTER — Ambulatory Visit: Payer: Self-pay | Admitting: Family

## 2013-10-29 ENCOUNTER — Ambulatory Visit (INDEPENDENT_AMBULATORY_CARE_PROVIDER_SITE_OTHER): Payer: BC Managed Care – PPO | Admitting: Internal Medicine

## 2013-10-29 ENCOUNTER — Encounter: Payer: Self-pay | Admitting: Internal Medicine

## 2013-10-29 VITALS — BP 124/72 | HR 75 | Temp 98.2°F | Wt 223.0 lb

## 2013-10-29 DIAGNOSIS — R040 Epistaxis: Secondary | ICD-10-CM

## 2013-10-29 DIAGNOSIS — N390 Urinary tract infection, site not specified: Secondary | ICD-10-CM

## 2013-10-29 NOTE — Assessment & Plan Note (Signed)
On and off nosebleeds for a while, R sided, has seen ENT before, last visit was last year, different treatment modalities were discussed. She likes to be seen by ENT again, will refer to Dr. Redmond Baseman per patient's request. In the meantime recommend Vaseline and afrin prn

## 2013-10-29 NOTE — Patient Instructions (Signed)
Use Vaseline at night If bleeding, put pressure and use afrin  Call or go to the ER f if the bleeding severe or doesn't stop      Nosebleed Nosebleeds can be caused by many conditions, including trauma, infections, polyps, foreign bodies, dry mucous membranes or climate, medicines, and air conditioning. Most nosebleeds occur in the front of the nose. Because of this location, most nosebleeds can be controlled by pinching the nostrils gently and continuously for at least 10 to 20 minutes. The long, continuous pressure allows enough time for the blood to clot. If pressure is released during that 10 to 20 minute time period, the process may have to be started again. The nosebleed may stop by itself or quit with pressure, or it may need concentrated heating (cautery) or pressure from packing. HOME CARE INSTRUCTIONS   If your nose was packed, try to maintain the pack inside until your health care provider removes it. If a gauze pack was used and it starts to fall out, gently replace it or cut the end off. Do not cut if a balloon catheter was used to pack the nose. Otherwise, do not remove unless instructed.  Avoid blowing your nose for 12 hours after treatment. This could dislodge the pack or clot and start the bleeding again.  If the bleeding starts again, sit up and bend forward, gently pinching the front half of your nose continuously for 20 minutes.  If bleeding was caused by dry mucous membranes, use over-the-counter saline nasal spray or gel. This will keep the mucous membranes moist and allow them to heal. If you must use a lubricant, choose the water-soluble variety. Use it only sparingly and not within several hours of lying down.  Do not use petroleum jelly or mineral oil, as these may drip into the lungs and cause serious problems.  Maintain humidity in your home by using less air conditioning or by using a humidifier.  Do not use aspirin or medicines which make bleeding more likely.  Your health care provider can give you recommendations on this.  Resume normal activities as you are able, but try to avoid straining, lifting, or bending at the waist for several days.  If the nosebleeds become recurrent and the cause is unknown, your health care provider may suggest laboratory tests. SEEK MEDICAL CARE IF: You have a fever. SEEK IMMEDIATE MEDICAL CARE IF:   Bleeding recurs and cannot be controlled.  There is unusual bleeding from or bruising on other parts of the body.  Nosebleeds continue.  There is any worsening of the condition which originally brought you in.  You become light-headed, feel faint, become sweaty, or vomit blood. MAKE SURE YOU:   Understand these instructions.  Will watch your condition.  Will get help right away if you are not doing well or get worse. Document Released: 11/07/2004 Document Revised: 06/14/2013 Document Reviewed: 12/29/2008 [facility] Patient Information 2015 [place], [place]. This information is not intended to replace advice given to you by your health care provider. Make sure you discuss any questions you have with your health care provider.  at night at the nose

## 2013-10-29 NOTE — Progress Notes (Signed)
   Subjective:    Patient ID: [name] [name], female    DOB: 1980/06/23, 33 y.o.   MRN: [id number]  DOS:  10/29/2013 Type of visit - description : acute, CC nosebleed Interval history: Developed right-sided nosebleed today, reports that it was gushing, it stopped after she put pressure.    ROS Denies fever, chills, no runny nose or sore throat, she admits to sneezing significantly. No blood in the stools or in the urine. She did have a couple of UTIs, status post urology eval, see assessment and plan  Past Medical History  Diagnosis Date  . Multiple allergies     chronic sneezing  . Uterine fibroid   . Gestational diabetes   . Vaginal Pap smear, abnormal   . AMA (advanced maternal age) multigravida 15+   . Sickle cell trait     Past Surgical History  Procedure Laterality Date  . Leg surgery      --R--from absecss with reconstructive flap- 2004  . Leg discrepancy    . Cesarean section with bilateral tubal ligation Bilateral 05/29/2013    Procedure: CESAREAN SECTION WITH BILATERAL TUBAL LIGATION;  Surgeon: Luz Lex, MD;  Location: [facility];  Service: Obstetrics;  Laterality: Bilateral;  PRIMARY EDC 4/30    History   Social History  . Marital Status: Married    Spouse Name: N/A    Number of Children: 3  . Years of Education: N/A   Occupational History  . 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 Smoking status: Never Smoker   . Smokeless tobacco: Never Used  . Alcohol Use: No  . Drug Use: No  . Sexual Activity: Not Currently   Other Topics Concern  . Not on file   Social History Narrative   Original from [place]         Medication List       This list is accurate as of: 10/29/13 11:59 PM.  Always use your most recent med list.               prenatal multivitamin Tabs tablet  Take 1 tablet by mouth daily.           Objective:   Physical Exam BP 124/72  Pulse 75  Temp(Src) 98.2 F (36.8 C) (Oral)  Wt 223 lb (101.152 kg)   SpO2 98% General -- alert, well-developed, NAD.  HEENT-- Not pale. TMs normal, throat symmetric, no redness or discharge. Face symmetric, sinuses not tender to palpation.  Nose   L normal R-- Turbinates enlarged, no fresh or old blood, no ulcers. Small amount of mucus Neurologic--  alert & oriented X3. Speech normal, gait appropriate for age, strength symmetric and appropriate for age.  Psych-- Cognition and judgment appear intact. Cooperative with normal attention span and concentration. No anxious or depressed appearing.        Assessment & Plan:

## 2013-10-29 NOTE — Assessment & Plan Note (Signed)
Patient reports had 2 recent UTIs, gynecologist sent her to urology, she had a scan , a scope  and everything was okay.

## 2013-10-29 NOTE — Progress Notes (Signed)
Pre visit review using our clinic review tool, if applicable. No additional management support is needed unless otherwise documented below in the visit note. 

## 2013-11-11 DIAGNOSIS — K8020 Calculus of gallbladder without cholecystitis without obstruction: Secondary | ICD-10-CM

## 2013-11-11 HISTORY — DX: Calculus of gallbladder without cholecystitis without obstruction: K80.20

## 2013-11-24 ENCOUNTER — Ambulatory Visit (INDEPENDENT_AMBULATORY_CARE_PROVIDER_SITE_OTHER): Payer: BC Managed Care – PPO | Admitting: Physician Assistant

## 2013-11-24 VITALS — BP 121/84 | HR 75 | Temp 97.6°F | Wt 225.0 lb

## 2013-11-24 DIAGNOSIS — R109 Unspecified abdominal pain: Secondary | ICD-10-CM

## 2013-11-24 LAB — POCT URINALYSIS DIPSTICK
BILIRUBIN UA: NEGATIVE
Blood, UA: NEGATIVE
GLUCOSE UA: NEGATIVE
Ketones, UA: NEGATIVE
Nitrite, UA: NEGATIVE
PH UA: 6
Protein, UA: NEGATIVE
Spec Grav, UA: 1.015
Urobilinogen, UA: 0.2

## 2013-11-24 LAB — POCT URINE PREGNANCY: PREG TEST UR: NEGATIVE

## 2013-11-24 NOTE — Patient Instructions (Addendum)
Increase fluid intake.  Take a daily probiotic. Read diet below for diarrhea -- this works for abdominal upset as well.  Your urine looks good in clinic but we will send for culture.  I will call you with all of your lab results.  This is likely an atypical viral gastroenteritis that is resolving as your symptoms have improved. Tylenol can be used for pain if needed. If labs are normal and symptoms persist, we will need to proceed with further labs and imaging.  Viral Gastroenteritis Viral gastroenteritis is also known as stomach flu. This condition affects the stomach and intestinal tract. It can cause sudden diarrhea and vomiting. The illness typically lasts 3 to 8 days. Most people develop an immune response that eventually gets rid of the virus. While this natural response develops, the virus can make you quite ill. CAUSES  Many different viruses can cause gastroenteritis, such as rotavirus or noroviruses. You can catch one of these viruses by consuming contaminated food or water. You may also catch a virus by sharing utensils or other personal items with an infected person or by touching a contaminated surface. SYMPTOMS  The most common symptoms are diarrhea and vomiting. These problems can cause a severe loss of body fluids (dehydration) and a body salt (electrolyte) imbalance. Other symptoms may include:  Fever.  Headache.  Fatigue.  Abdominal pain. DIAGNOSIS  Your caregiver can usually diagnose viral gastroenteritis based on your symptoms and a physical exam. A stool sample may also be taken to test for the presence of viruses or other infections. TREATMENT  This illness typically goes away on its own. Treatments are aimed at rehydration. The most serious cases of viral gastroenteritis involve vomiting so severely that you are not able to keep fluids down. In these cases, fluids must be given through an intravenous line (IV). HOME CARE INSTRUCTIONS   Drink enough fluids to keep your  urine clear or pale yellow. Drink small amounts of fluids frequently and increase the amounts as tolerated.  Ask your caregiver for specific rehydration instructions.  Avoid:  Foods high in sugar.  Alcohol.  Carbonated drinks.  Tobacco.  Juice.  Caffeine drinks.  Extremely hot or cold fluids.  Fatty, greasy foods.  Too much intake of anything at one time.  Dairy products until 24 to 48 hours after diarrhea stops.  You may consume probiotics. Probiotics are active cultures of beneficial bacteria. They may lessen the amount and number of diarrheal stools in adults. Probiotics can be found in yogurt with active cultures and in supplements.  Wash your hands well to avoid spreading the virus.  Only take over-the-counter or prescription medicines for pain, discomfort, or fever as directed by your caregiver. Do not give aspirin to children. Antidiarrheal medicines are not recommended.  Ask your caregiver if you should continue to take your regular prescribed and over-the-counter medicines.  Keep all follow-up appointments as directed by your caregiver. SEEK IMMEDIATE MEDICAL CARE IF:   You are unable to keep fluids down.  You do not urinate at least once every 6 to 8 hours.  You develop shortness of breath.  You notice blood in your stool or vomit. This may look like coffee grounds.  You have abdominal pain that increases or is concentrated in one small area (localized).  You have persistent vomiting or diarrhea.  You have a fever.  The patient is a child younger than 3 months, and he or she has a fever.  The patient is a child older  than 3 months, and he or she has a fever and persistent symptoms.  The patient is a child older than 3 months, and he or she has a fever and symptoms suddenly get worse.  The patient is a baby, and he or she has no tears when crying. MAKE SURE YOU:   Understand these instructions.  Will watch your condition.  Will get help right  away if you are not doing well or get worse. Document Released: 01/28/2005 Document Revised: 04/22/2011 Document Reviewed: 11/14/2010 [facility] Patient Information 2015 [place], [place]. This information is not intended to replace advice given to you by your health care provider. Make sure you discuss any questions you have with your health care provider.  Food Choices to Help Relieve Diarrhea When you have diarrhea, the foods you eat and your eating habits are very important. Choosing the right foods and drinks can help relieve diarrhea. Also, because diarrhea can last up to 7 days, you need to replace lost fluids and electrolytes (such as sodium, potassium, and chloride) in order to help prevent dehydration.  WHAT GENERAL GUIDELINES DO I NEED TO FOLLOW?  Slowly drink 1 cup (8 oz) of fluid for each episode of diarrhea. If you are getting enough fluid, your urine will be clear or pale yellow.  Eat starchy foods. Some good choices include white rice, white toast, pasta, low-fiber cereal, baked potatoes (without the skin), saltine crackers, and bagels.  Avoid large servings of any cooked vegetables.  Limit fruit to two servings per day. A serving is  cup or 1 small piece.  Choose foods with less than 2 g of fiber per serving.  Limit fats to less than 8 tsp (38 g) per day.  Avoid fried foods.  Eat foods that have probiotics in them. Probiotics can be found in certain dairy products.  Avoid foods and beverages that may increase the speed at which food moves through the stomach and intestines (gastrointestinal tract). Things to avoid include:  High-fiber foods, such as dried fruit, raw fruits and vegetables, nuts, seeds, and whole grain foods.  Spicy foods and high-fat foods.  Foods and beverages sweetened with high-fructose corn syrup, honey, or sugar alcohols such as xylitol, sorbitol, and mannitol. WHAT FOODS ARE RECOMMENDED? Grains White rice. White, [place], or pita breads (fresh or  toasted), including plain rolls, buns, or bagels. White pasta. Saltine, soda, or graham crackers. Pretzels. Low-fiber cereal. Cooked cereals made with water (such as cornmeal, farina, or cream cereals). Plain muffins. Matzo. Melba toast. Zwieback.  Vegetables Potatoes (without the skin). Strained tomato and vegetable juices. Most well-cooked and canned vegetables without seeds. Tender lettuce. Fruits Cooked or canned applesauce, apricots, cherries, fruit cocktail, grapefruit, peaches, pears, or plums. Fresh bananas, apples without skin, cherries, grapes, cantaloupe, grapefruit, peaches, oranges, or plums.  Meat and Other Protein Products Baked or boiled chicken. Eggs. Tofu. Fish. Seafood. Smooth peanut butter. Ground or well-cooked tender beef, ham, veal, lamb, pork, or poultry.  Dairy Plain yogurt, kefir, and unsweetened liquid yogurt. Lactose-free milk, buttermilk, or soy milk. Plain hard cheese. Beverages Sport drinks. Clear broths. Diluted fruit juices (except prune). Regular, caffeine-free sodas such as ginger ale. Water. Decaffeinated teas. Oral rehydration solutions. Sugar-free beverages not sweetened with sugar alcohols. Other Bouillon, broth, or soups made from recommended foods.  The items listed above may not be a complete list of recommended foods or beverages. Contact your dietitian for more options. WHAT FOODS ARE NOT RECOMMENDED? Grains Whole grain, whole wheat, bran, or rye breads, rolls,  pastas, crackers, and cereals. Wild or brown rice. Cereals that contain more than 2 g of fiber per serving. Corn tortillas or taco shells. Cooked or dry oatmeal. Granola. Popcorn. Vegetables Raw vegetables. Cabbage, broccoli, Brussels sprouts, artichokes, baked beans, beet greens, corn, kale, legumes, peas, sweet potatoes, and yams. Potato skins. Cooked spinach and cabbage. Fruits Dried fruit, including raisins and dates. Raw fruits. Stewed or dried prunes. Fresh apples with skin, apricots,  mangoes, pears, raspberries, and strawberries.  Meat and Other Protein Products Chunky peanut butter. Nuts and seeds. Beans and lentils. Berniece Salines.  Dairy High-fat cheeses. Milk, chocolate milk, and beverages made with milk, such as milk shakes. Cream. Ice cream. Sweets and Desserts Sweet rolls, doughnuts, and sweet breads. Pancakes and waffles. Fats and Oils Butter. Cream sauces. Margarine. Salad oils. Plain salad dressings. Olives. Avocados.  Beverages Caffeinated beverages (such as coffee, tea, soda, or energy drinks). Alcoholic beverages. Fruit juices with pulp. Prune juice. Soft drinks sweetened with high-fructose corn syrup or sugar alcohols. Other Coconut. Hot sauce. Chili powder. Mayonnaise. Gravy. Cream-based or milk-based soups.  The items listed above may not be a complete list of foods and beverages to avoid. Contact your dietitian for more information. WHAT SHOULD I DO IF I BECOME DEHYDRATED? Diarrhea can sometimes lead to dehydration. Signs of dehydration include dark urine and dry mouth and skin. If you think you are dehydrated, you should rehydrate with an oral rehydration solution. These solutions can be purchased at pharmacies, retail stores, or online.  Drink -1 cup (120-240 mL) of oral rehydration solution each time you have an episode of diarrhea. If drinking this amount makes your diarrhea worse, try drinking smaller amounts more often. For example, drink 1-3 tsp (5-15 mL) every 5-10 minutes.  A general rule for staying hydrated is to drink 1-2 L of fluid per day. Talk to your health care provider about the specific amount you should be drinking each day. Drink enough fluids to keep your urine clear or pale yellow. Document Released: 04/20/2003 Document Revised: 02/02/2013 Document Reviewed: 12/21/2012 [facility] Patient Information 2015 [place], [place]. This information is not intended to replace advice given to you by your health care provider. Make sure you discuss any  questions you have with your health care provider.

## 2013-11-24 NOTE — Progress Notes (Signed)
Pre visit review using our clinic review tool, if applicable. No additional management support is needed unless otherwise documented below in the visit note. 

## 2013-11-25 LAB — COMPREHENSIVE METABOLIC PANEL
ALT: 1152 U/L — AB (ref 0–35)
AST: 714 U/L — ABNORMAL HIGH (ref 0–37)
Albumin: 3.5 g/dL (ref 3.5–5.2)
Alkaline Phosphatase: 196 U/L — ABNORMAL HIGH (ref 39–117)
BILIRUBIN TOTAL: 1.4 mg/dL — AB (ref 0.2–1.2)
BUN: 9 mg/dL (ref 6–23)
CO2: 22 mEq/L (ref 19–32)
Calcium: 9.1 mg/dL (ref 8.4–10.5)
Chloride: 106 mEq/L (ref 96–112)
Creatinine, Ser: 0.8 mg/dL (ref 0.4–1.2)
GFR: 98.88 mL/min (ref >60.00)
Glucose, Bld: 129 mg/dL — ABNORMAL HIGH (ref 70–99)
Potassium: 3.7 mEq/L (ref 3.5–5.1)
Sodium: 137 mEq/L (ref 135–145)
Total Protein: 8.1 g/dL (ref 6.0–8.3)

## 2013-11-25 LAB — CBC
HCT: 36 % (ref 36.0–46.0)
Hemoglobin: 12 g/dL (ref 12.0–15.0)
MCHC: 33.3 g/dL (ref 30.0–36.0)
MCV: 87.8 fl (ref 78.0–100.0)
Platelets: 212 10*3/uL (ref 150.0–400.0)
RBC: 4.1 Mil/uL (ref 3.87–5.11)
RDW: 15.3 % (ref 11.5–15.5)
WBC: 4.8 10*3/uL (ref 4.0–10.5)

## 2013-11-25 LAB — H. PYLORI ANTIBODY, IGG: H Pylori IgG: NEGATIVE

## 2013-11-25 LAB — LIPASE: Lipase: 48 U/L (ref 11.0–59.0)

## 2013-11-26 ENCOUNTER — Telehealth: Payer: Self-pay | Admitting: Physician Assistant

## 2013-11-26 ENCOUNTER — Telehealth: Payer: Self-pay | Admitting: Internal Medicine

## 2013-11-26 ENCOUNTER — Ambulatory Visit (HOSPITAL_BASED_OUTPATIENT_CLINIC_OR_DEPARTMENT_OTHER)
Admission: RE | Admit: 2013-11-26 | Discharge: 2013-11-26 | Disposition: A | Discharge status: Home / Self Care | Payer: BC Managed Care – PPO | Source: Ambulatory Visit | Attending: Physician Assistant | Admitting: Physician Assistant

## 2013-11-26 DIAGNOSIS — R1011 Right upper quadrant pain: Secondary | ICD-10-CM | POA: Insufficient documentation

## 2013-11-26 DIAGNOSIS — K8020 Calculus of gallbladder without cholecystitis without obstruction: Secondary | ICD-10-CM

## 2013-11-26 DIAGNOSIS — R748 Abnormal levels of other serum enzymes: Secondary | ICD-10-CM | POA: Diagnosis not present

## 2013-11-26 LAB — URINE CULTURE
Colony Count: NO GROWTH
ORGANISM ID, BACTERIA: NO GROWTH

## 2013-11-26 NOTE — Telephone Encounter (Signed)
US reveals gallstone as potential cause of symptoms.  Still waiting on acute hepatitis panel. I am setting her up with GI urgently for further evaluation.

## 2013-11-26 NOTE — Telephone Encounter (Signed)
Attempt to reach pt via phone, line hang-up upon answering?; called home number and spoke with female [communication barrier] who states "patient is not home from work"; asked to please forward message that we are once again trying to reach patient to discuss results and her provider is needing her to come back to office for further lab work and also need to discuss [place] results and further provider instructions/SLS

## 2013-11-26 NOTE — Telephone Encounter (Signed)
Already handled.

## 2013-11-26 NOTE — Telephone Encounter (Signed)
Pt returning your call

## 2013-11-26 NOTE — Telephone Encounter (Signed)
Please inform patient that her liver enzymes are elevated as well as her Alk Phos (marker for gallbladder).  We need her to come in for further labs and an Abdominal US to assess her liver and gallbladder. (I have placed orders) We need to r/o a gallstone or acute hepatitis. How is she feeling?  She needs to stay well hydrated and avoid tylenol-containing products and alcohol.  Follow-up in office on Monday.

## 2013-11-26 NOTE — Telephone Encounter (Signed)
LMOM with contact name and number for return call RE: results and further provider instructions/SLS  

## 2013-11-26 NOTE — Telephone Encounter (Signed)
Requesting to speak to [facility] about [place] results

## 2013-11-26 NOTE — Telephone Encounter (Signed)
Spoke with patient personally and relayed results.  Patient feeling much better.  Has scheduled GI appointment next Monday. Waiting on Acute hepatitis panel results. Follow-up in 1 week.

## 2013-11-28 DIAGNOSIS — K8020 Calculus of gallbladder without cholecystitis without obstruction: Secondary | ICD-10-CM | POA: Insufficient documentation

## 2013-11-28 NOTE — Assessment & Plan Note (Signed)
Mostly RUQ and Epigastrium.  Will obtain UA, Urine Prgnancy, CBC, CMP, Lipase and H. Pylori.  Will also schedule abdominal US to further assess.  Increase fluids.  Rest.  Avoid NSAIDs, alcohol or spicy foods. Alarm signs/symptoms discussed with patient.  Will schedule follow-up based on lab results.  Addendum: LFTs and Alk Phos elevated.  US reveals cholelithiasis. Patient to return to clinic for Acute Hepatitis Panel.  Urgent GI referral placed.

## 2013-11-28 NOTE — Progress Notes (Signed)
Patient presents to clinic today c/o intermittent episodes of epigastric and RUQ abdominal pain over the past couple of days.  Pain is stabbing in nature. Non-radiating.  Patient denies change to diet.  Denies abnormal food or water source.  Endorses nausea but denies emesis, diarrhea, tenesmus, melena or hematochezia.  Denies urinary symptoms.  Denies fever, chills.  Endorses some fatigue and malaise.  Patient states pain is not related to mealtime. Denies recent alcohol consumption or NSAID use.  Past Medical History  Diagnosis Date  . Multiple allergies     chronic sneezing  . Uterine fibroid   . Gestational diabetes   . Vaginal Pap smear, abnormal   . AMA (advanced maternal age) multigravida 36+   . Sickle cell trait     Current Outpatient Prescriptions on File Prior to Visit  Medication Sig Dispense Refill  . Prenatal Vit-Fe Fumarate-FA (PRENATAL MULTIVITAMIN) TABS Take 1 tablet by mouth daily.       No current facility-administered medications on file prior to visit.    Allergies  Allergen Reactions  . Cephalexin     REACTION: Rash  . Fluviral [Flu Virus Vaccine] Other (See Comments)    unknown     Family History  Problem Relation Age of Onset  . Diabetes Other     GM  . Coronary artery disease Neg Hx   . Colon cancer Neg Hx   . Breast cancer Neg Hx   . Stroke Neg Hx     History   Social History  . Marital Status: Married    Spouse Name: N/A    Number of Children: 3  . Years of Education: N/A   Occupational History  . 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 Smoking status: Never Smoker   . Smokeless tobacco: Never Used  . Alcohol Use: No  . Drug Use: No  . Sexual Activity: Not Currently   Other Topics Concern  . Not on file   Social History Narrative   Original from [place]     Review of Systems - See HPI.  All other ROS are negative.  BP 121/84  Pulse 75  Temp(Src) 97.6 F (36.4 C)  Wt 225 lb (102.059 kg)  SpO2  100%  Physical Exam  Vitals reviewed. Constitutional: She is oriented to person, place, and time and well-developed, well-nourished, and in no distress.  HENT:  Head: Normocephalic and atraumatic.  Eyes: Conjunctivae are normal.  Cardiovascular: Normal rate, regular rhythm, normal heart sounds and intact distal pulses.   Pulmonary/Chest: Effort normal and breath sounds normal. No respiratory distress. She has no wheezes. She has no rales. She exhibits no tenderness.  Abdominal: Soft. Bowel sounds are normal. She exhibits no distension and no mass. There is no rebound and no guarding.  Mild diffuse abdominal pain with deep palpation.  Negative Murphy sign.  Neurological: She is alert and oriented to person, place, and time.  Skin: Skin is warm and dry. No rash noted.  Psychiatric: Affect normal.    Recent Results (from the past 2160 hour(s))  POCT URINE PREGNANCY     Status: None   Collection Time    11/24/13  7:04 PM      Result Value Ref Range   Preg Test, Ur Negative    POCT URINALYSIS DIPSTICK     Status: None   Collection Time    11/24/13  7:05 PM      Result Value Ref Range  Color, UA yellow     Clarity, UA clear     Glucose, UA neg     Bilirubin, UA neg     Ketones, UA neg     Spec Grav, UA 1.015     Blood, UA neg     pH, UA 6.0     Protein, UA neg     Urobilinogen, UA 0.2     Nitrite, UA neg     Leukocytes, UA small (1+)    CBC     Status: None   Collection Time    11/24/13  7:07 PM      Result Value Ref Range   WBC 4.8  4.0 - 10.5 K/uL   RBC 4.10  3.87 - 5.11 Mil/uL   Platelets 212.0  150.0 - 400.0 K/uL   Hemoglobin 12.0  12.0 - 15.0 g/dL   HCT 36.0  36.0 - 46.0 %   MCV 87.8  78.0 - 100.0 fl   MCHC 33.3  30.0 - 36.0 g/dL   RDW 15.3  11.5 - 15.5 %  COMPREHENSIVE METABOLIC PANEL     Status: Abnormal   Collection Time    11/24/13  7:07 PM      Result Value Ref Range   Sodium 137  135 - 145 mEq/L   Potassium 3.7  3.5 - 5.1 mEq/L   Chloride 106  96 - 112  mEq/L   CO2 22  19 - 32 mEq/L   Glucose, Bld 129 (*) 70 - 99 mg/dL   BUN 9  6 - 23 mg/dL   Creatinine, Ser 0.8  0.4 - 1.2 mg/dL   Total Bilirubin 1.4 (*) 0.2 - 1.2 mg/dL   Alkaline Phosphatase 196 (*) 39 - 117 U/L   AST 714 (*) 0 - 37 U/L   ALT 1152 (*) 0 - 35 U/L   Total Protein 8.1  6.0 - 8.3 g/dL   Albumin 3.5  3.5 - 5.2 g/dL   Calcium 9.1  8.4 - 10.5 mg/dL   GFR 98.88  >60.00 mL/min  H. PYLORI ANTIBODY, IGG     Status: None   Collection Time    11/24/13  7:07 PM      Result Value Ref Range   H Pylori IgG Negative  Negative  LIPASE     Status: None   Collection Time    11/24/13  7:07 PM      Result Value Ref Range   Lipase 48.0  11.0 - 59.0 U/L  URINE CULTURE     Status: None   Collection Time    11/24/13  7:13 PM      Result Value Ref Range   Colony Count NO GROWTH     Organism ID, Bacteria NO GROWTH      Assessment/Plan: Symptomatic cholelithiasis Mostly RUQ and Epigastrium.  Will obtain UA, Urine Prgnancy, CBC, CMP, Lipase and H. Pylori.  Will also schedule abdominal US to further assess.  Increase fluids.  Rest.  Avoid NSAIDs, alcohol or spicy foods. Alarm signs/symptoms discussed with patient.  Will schedule follow-up based on lab results.  Addendum: LFTs and Alk Phos elevated.  US reveals cholelithiasis. Patient to return to clinic for Acute Hepatitis Panel.  Urgent GI referral placed.

## 2013-11-29 ENCOUNTER — Other Ambulatory Visit (INDEPENDENT_AMBULATORY_CARE_PROVIDER_SITE_OTHER): Payer: BC Managed Care – PPO

## 2013-11-29 ENCOUNTER — Ambulatory Visit (INDEPENDENT_AMBULATORY_CARE_PROVIDER_SITE_OTHER): Payer: BC Managed Care – PPO | Admitting: Gastroenterology

## 2013-11-29 ENCOUNTER — Encounter: Payer: Self-pay | Admitting: Gastroenterology

## 2013-11-29 VITALS — BP 110/66 | HR 60 | Ht 67.5 in | Wt 224.2 lb

## 2013-11-29 DIAGNOSIS — R7989 Other specified abnormal findings of blood chemistry: Secondary | ICD-10-CM

## 2013-11-29 DIAGNOSIS — R1011 Right upper quadrant pain: Secondary | ICD-10-CM

## 2013-11-29 DIAGNOSIS — R945 Abnormal results of liver function studies: Principal | ICD-10-CM

## 2013-11-29 DIAGNOSIS — K8020 Calculus of gallbladder without cholecystitis without obstruction: Secondary | ICD-10-CM

## 2013-11-29 LAB — HEPATIC FUNCTION PANEL
ALT: 287 U/L — ABNORMAL HIGH (ref 0–35)
AST: 66 U/L — AB (ref 0–37)
Albumin: 3.8 g/dL (ref 3.5–5.2)
Alkaline Phosphatase: 186 U/L — ABNORMAL HIGH (ref 39–117)
BILIRUBIN DIRECT: 0.2 mg/dL (ref 0.0–0.3)
BILIRUBIN TOTAL: 0.8 mg/dL (ref 0.2–1.2)
TOTAL PROTEIN: 8.5 g/dL — AB (ref 6.0–8.3)

## 2013-11-29 LAB — LIPASE: Lipase: 39 U/L (ref 11.0–59.0)

## 2013-11-29 NOTE — Patient Instructions (Addendum)
Your physician has requested that you go to the basement for the following lab work before leaving today: Lipase  Hepatic Function Panel   You are scheduled for a MRCP at [facility] on December 06, 2013 at 7:00 pm. Please go to the first floor of [facility] for registration. Please nothing to eat or drink four hours prior to scan.

## 2013-11-29 NOTE — Progress Notes (Signed)
Patient ID: [name] [name], female   DOB: Apr 17, 1980, 33 y.o.   MRN: [id number]    HPI: [name] [name] is a 33 year old [place] female who was referred for evaluation of abnormal liver enzymes and epigastric pain by [name] Noble, PA-C. [name] [name] states that over the past 1-1/2-2 years she has had intermittent episodes of postprandial epigastric pain that sometimes radiates to the right upper quadrant and shoulder blade. These episodes are accompanied by pyrosis and nausea, but no vomiting. Her episodes of pain have been more frequent since she had her baby in April, 2015. On October 12, she ate at [employer]. Several hours later she developed severe epigastric pain and nausea along with dark-colored urine. She was seen by her primary care physician and found to have elevated liver enzymes tests. She had an abdominal ultrasound that showed cholelithiasis. She has no pain today. She does report that she has to be careful with what she eats because anything that has a higher fat content or is rich typically causes her to have the pain.     The patient denies use of alcohol and denies prior use of intravenous drugs. She denies use of intranasal cocaine. She has not had any tattoos and has not had any blood transfusions. She is not on any new medications. She has no prior history of hepatitis.   Past Medical History  Diagnosis Date  . Multiple allergies     chronic sneezing  . Uterine fibroid   . Gestational diabetes   . Vaginal Pap smear, abnormal   . AMA (advanced maternal age) multigravida 21+   . Sickle cell trait     Past Surgical History  Procedure Laterality Date  . Leg surgery      --R--from absecss with reconstructive flap- 2004  . Leg discrepancy    . Cesarean section with bilateral tubal ligation Bilateral 05/29/2013    Procedure: CESAREAN SECTION WITH BILATERAL TUBAL LIGATION;  Surgeon: [name] Lex, MD;  Location: [facility];  Service: Obstetrics;  Laterality: Bilateral;  PRIMARY EDC 4/30    Family History  Problem Relation Age of Onset  . Diabetes Other     GM  . Coronary artery disease Neg Hx   . Colon cancer Neg Hx   . Breast cancer Neg Hx   . Stroke Neg Hx    History  Substance Use Topics  . Smoking status: Never Smoker   . Smokeless tobacco: Never Used  . Alcohol Use: No   Current Outpatient Prescriptions  Medication Sig Dispense Refill  . Prenatal Vit-Fe Fumarate-FA (PRENATAL MULTIVITAMIN) TABS Take 1 tablet by mouth daily.       No current facility-administered medications for this visit.   Allergies  Allergen Reactions  . Cephalexin     REACTION: Rash  . Fluviral [Flu Virus Vaccine] Other (See Comments)    unknown   . Ivp Dye [Iodinated Diagnostic Agents]      Review of Systems: Gen: Denies any fever, chills CV: Denies  angina, palpitations Resp: Denies dyspnea at rest, dyspnea with exercise GI: Denies vomiting blood, jaundice, and fecal incontinence.   Denies dysphagia or odynophagia. GU : Denies urinary burning, blood in urine, urinary frequency, urinary hesitancy, nocturnal urination, and urinary incontinence. MS: Denies joint pain, limitation of movement, and swelling, stiffness, low back pain, extremity pain. Denies muscle weakness, cramps, atrophy.  Derm: Denies rash, itching, dry skin, hives, moles, warts, or unhealing ulcers.  Psych: Denies depression, anxiety, memory loss, suicidal ideation, hallucinations, paranoia, and confusion. Heme: Denies  bruising, bleeding, and enlarged lymph nodes. Neuro:  Denies any headaches, dizziness, paresthesias. Endo:  Denies any problems with DM, thyroid, adrenal function  Studies: US Abdomen Complete  11/26/2013   CLINICAL DATA:  Elevated liver enzymes; intermittent right upper quadrant pain  EXAM: ULTRASOUND ABDOMEN COMPLETE  COMPARISON:  None.  FINDINGS: Gallbladder: Within the gallbladder, there are multiple echogenic foci which move and shadow consistent with gallstones. Largest individual gallstone  measures 1.0 cm in length. There is no gallbladder wall thickening or pericholecystic fluid. No sonographic Murphy sign noted.  Common bile duct: Diameter: 1 mm. There is no intrahepatic, common hepatic, or common bile duct dilatation.  Liver: No focal lesion identified. Liver echogenicity is mildly increased.  IVC: No abnormality visualized.  Pancreas: No mass or inflammatory focus.  Spleen: Size and appearance within normal limits.  Right Kidney: Length: 11.2 cm. Echogenicity within normal limits. No mass or hydronephrosis visualized.  Left Kidney: Length: 11.6 cm. Echogenicity within normal limits. No mass or hydronephrosis visualized.  Abdominal aorta: No aneurysm visualized.  Other findings: No demonstrable ascites.  IMPRESSION: Cholelithiasis. Slight increase in overall liver echotexture. This finding is probably due to hepatic steatosis. While no focal liver lesions are identified, it must be cautioned that the sensitivity of ultrasound for focal liver lesions is diminished in this circumstance. Study otherwise unremarkable.   Electronically Signed   By: [name] [name] M.D.   On: 11/26/2013 09:52   LABS: Blood work done on 1014 again was as follows: Lipase 48. H. pylori IgG negative. Comprehensive metabolic panel showed a total bili of 1.4, alkaline phosphatase of 196, AST 714, ALT 1152.  CBC a white blood cell count of 4.8, hematocrit 36, hemoglobin 12, platelets 212.   Physical Exam: BP 110/66  Pulse 60  Ht 5' 7.5" (1.715 m)  Wt 224 lb 3.2 oz (101.696 kg)  BMI 34.58 kg/m2  LMP 11/13/2013 Constitutional: Pleasant,well-developed,  AA female in no acute distress. HEENT: Normocephalic and atraumatic. Conjunctivae are normal. No scleral icterus. Neck supple. No thyromegaly Cardiovascular: Normal rate, regular rhythm.  Pulmonary/chest: Effort normal and breath sounds normal. No wheezing, rales or rhonchi. Abdominal: Soft, nondistended, nontender. Bowel sounds active throughout. There are no  masses palpable. No hepatomegaly. Extremities: no edema Lymphadenopathy: No cervical adenopathy noted. Neurological: Alert and oriented to person place and time. Skin: Skin is warm and dry. No rashes noted. Psychiatric: Normal mood and affect. Behavior is normal.  ASSESSMENT AND PLAN: 26. 33 year old African American female found to have elevated liver enzymes after an episode of severe epigastric pain. Ultrasound of the abdomen revealed cholelithiasis. Her abnormal liver enzymes may indeed be due to a stone in the common bile duct. Repeat hepatic panel and lipase will be obtained today and she will be scheduled for an MRCP if her liver enzymes remain markedly elevated a hepatitis panel will be obtained. We have explained to the patient that she may comment to me and ERCP and cholecystectomy and have offered to make a surgical referral at this time. However, the patient is adamant that she does not want a surgical appointment until she receives the results of her MRCP. Further recommendations will be made pending the findings of her repeat blood work and MRCP .    Aviana Shevlin, Vita Barley PA-C 11/29/2013, 3:51 PM   ________________________________________________________________________  Velora Heckler GI MD note:  I personally examined the patient, reviewed the data and agree with the assessment and plan described above.   Owens Loffler, MD [facility] Pager [phone]

## 2013-12-06 ENCOUNTER — Other Ambulatory Visit: Payer: Self-pay | Admitting: Physician Assistant

## 2013-12-06 ENCOUNTER — Ambulatory Visit (HOSPITAL_COMMUNITY)
Admission: RE | Admit: 2013-12-06 | Discharge: 2013-12-06 | Disposition: A | Discharge status: Home / Self Care | Payer: BC Managed Care – PPO | Source: Ambulatory Visit | Attending: Physician Assistant | Admitting: Physician Assistant

## 2013-12-06 DIAGNOSIS — K760 Fatty (change of) liver, not elsewhere classified: Secondary | ICD-10-CM | POA: Insufficient documentation

## 2013-12-06 DIAGNOSIS — R1011 Right upper quadrant pain: Secondary | ICD-10-CM | POA: Insufficient documentation

## 2013-12-06 DIAGNOSIS — R7989 Other specified abnormal findings of blood chemistry: Secondary | ICD-10-CM

## 2013-12-06 DIAGNOSIS — K8020 Calculus of gallbladder without cholecystitis without obstruction: Secondary | ICD-10-CM | POA: Diagnosis not present

## 2013-12-06 DIAGNOSIS — R160 Hepatomegaly, not elsewhere classified: Secondary | ICD-10-CM | POA: Diagnosis not present

## 2013-12-06 DIAGNOSIS — R945 Abnormal results of liver function studies: Secondary | ICD-10-CM

## 2013-12-07 ENCOUNTER — Other Ambulatory Visit: Payer: Self-pay

## 2013-12-07 DIAGNOSIS — R945 Abnormal results of liver function studies: Principal | ICD-10-CM

## 2013-12-07 DIAGNOSIS — R7989 Other specified abnormal findings of blood chemistry: Secondary | ICD-10-CM

## 2013-12-10 ENCOUNTER — Other Ambulatory Visit (INDEPENDENT_AMBULATORY_CARE_PROVIDER_SITE_OTHER): Payer: BC Managed Care – PPO

## 2013-12-10 DIAGNOSIS — R7989 Other specified abnormal findings of blood chemistry: Secondary | ICD-10-CM

## 2013-12-10 DIAGNOSIS — R945 Abnormal results of liver function studies: Principal | ICD-10-CM

## 2013-12-10 LAB — HEPATIC FUNCTION PANEL
ALT: 32 U/L (ref 0–35)
AST: 23 U/L (ref 0–37)
Albumin: 3.6 g/dL (ref 3.5–5.2)
Alkaline Phosphatase: 109 U/L (ref 39–117)
BILIRUBIN TOTAL: 0.7 mg/dL (ref 0.2–1.2)
Bilirubin, Direct: 0.1 mg/dL (ref 0.0–0.3)
TOTAL PROTEIN: 8 g/dL (ref 6.0–8.3)

## 2013-12-10 LAB — LIPASE: LIPASE: 40 U/L (ref 11.0–59.0)

## 2013-12-13 ENCOUNTER — Encounter: Payer: Self-pay | Admitting: Gastroenterology

## 2014-02-01 ENCOUNTER — Ambulatory Visit (INDEPENDENT_AMBULATORY_CARE_PROVIDER_SITE_OTHER): Payer: BC Managed Care – PPO | Admitting: Internal Medicine

## 2014-02-01 ENCOUNTER — Encounter: Payer: Self-pay | Admitting: Internal Medicine

## 2014-02-01 VITALS — BP 118/64 | HR 77 | Temp 98.6°F | Ht 68.0 in | Wt 221.4 lb

## 2014-02-01 DIAGNOSIS — K8020 Calculus of gallbladder without cholecystitis without obstruction: Secondary | ICD-10-CM

## 2014-02-01 DIAGNOSIS — Z0000 Encounter for general adult medical examination without abnormal findings: Secondary | ICD-10-CM

## 2014-02-01 LAB — COMPREHENSIVE METABOLIC PANEL
ALK PHOS: 82 U/L (ref 39–117)
ALT: 19 U/L (ref 0–35)
AST: 21 U/L (ref 0–37)
Albumin: 4 g/dL (ref 3.5–5.2)
BUN: 17 mg/dL (ref 6–23)
CO2: 26 mEq/L (ref 19–32)
Calcium: 9 mg/dL (ref 8.4–10.5)
Chloride: 105 mEq/L (ref 96–112)
Creatinine, Ser: 0.7 mg/dL (ref 0.4–1.2)
GFR: 116.15 mL/min (ref >60.00)
Glucose, Bld: 122 mg/dL — ABNORMAL HIGH (ref 70–99)
Potassium: 3.9 mEq/L (ref 3.5–5.1)
SODIUM: 137 meq/L (ref 135–145)
TOTAL PROTEIN: 7.5 g/dL (ref 6.0–8.3)
Total Bilirubin: 0.4 mg/dL (ref 0.2–1.2)

## 2014-02-01 LAB — LIPID PANEL
CHOL/HDL RATIO: 4
Cholesterol: 148 mg/dL (ref 0–200)
HDL: 34.6 mg/dL — ABNORMAL LOW (ref >39.00)
LDL CALC: 103 mg/dL — AB (ref 0–99)
NONHDL: 113.4
Triglycerides: 52 mg/dL (ref 0.0–149.0)
VLDL: 10.4 mg/dL (ref 0.0–40.0)

## 2014-02-01 NOTE — Progress Notes (Signed)
   Subjective:    Patient ID: [name] [name], female    DOB: Mar 28, 1980, 33 y.o.   MRN: [id number]  DOS:  02/01/2014 Type of visit - description : cpx Interval history: no concerns Trying to eat healthy, goes to the gym sometimes.   ROS Denies chest pain or difficulty breathing. 2 days ago had a ill-defined lower abdominal pain that self resolve, denies nausea, vomiting, diarrhea or blood in the stools. Otherwise no GI symptoms. No cough, sputum production or wheezing No anxiety depression No dysuria, gross hematuria difficulty urinating  Past Medical History  Diagnosis Date  . Multiple allergies     chronic sneezing  . Uterine fibroid   . Gestational diabetes   . Vaginal Pap smear, abnormal   . AMA (advanced maternal age) multigravida 20+   . Sickle cell trait   . Cholelithiases 11-2013    elevated LFTs, declined see surgery    Past Surgical History  Procedure Laterality Date  . Leg surgery      --R--from absecss with reconstructive flap- 2004  . Leg discrepancy    . Cesarean section with bilateral tubal ligation Bilateral 05/29/2013    Procedure: CESAREAN SECTION WITH BILATERAL TUBAL LIGATION;  Surgeon: Luz Lex, MD;  Location: [facility];  Service: Obstetrics;  Laterality: Bilateral;  PRIMARY EDC 4/30    History   Social History  . Marital Status: Married    Spouse Name: N/A    Number of Children: 3  . Years of Education: N/A   Occupational History  . 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 Smoking status: Never Smoker   . Smokeless tobacco: Never Used  . Alcohol Use: No  . Drug Use: No  . Sexual Activity: Not Currently   Other Topics Concern  . Not on file   Social History Narrative   Original from [place]      Family History  Problem Relation Age of Onset  . Diabetes Other     GM  . Coronary artery disease Neg Hx   . Colon cancer Neg Hx   . Breast cancer Neg Hx   . Stroke Neg Hx        Medication List       This list is  accurate as of: 02/01/14 11:59 PM.  Always use your most recent med list.               prenatal multivitamin Tabs tablet  Take 1 tablet by mouth daily.           Objective:   Physical Exam BP 118/64 mmHg  Pulse 77  Temp(Src) 98.6 F (37 C) (Oral)  Ht 5\' 8"  (1.727 m)  Wt 221 lb 6 oz (100.415 kg)  BMI 33.67 kg/m2  SpO2 98%  LMP 01/24/2014  Breastfeeding? Yes General -- alert, well-developed, NAD.  Neck --no thyromegaly  HEENT-- Not pale.   Lungs -- normal respiratory effort, no intercostal retractions, no accessory muscle use, and normal breath sounds.  Heart-- normal rate, regular rhythm, no murmur.  Abdomen-- Not distended, good bowel sounds,soft, non-tender. No rebound or rigidity. No mass  Extremities-- no pretibial edema bilaterally  Neurologic--  alert & oriented X3. Speech normal, gait appropriate for age, strength symmetric and appropriate for age.  Psych-- Cognition and judgment appear intact. Cooperative with normal attention span and concentration. No anxious or depressed appearing.     Assessment & Plan:

## 2014-02-01 NOTE — Progress Notes (Signed)
Pre visit review using our clinic review tool, if applicable. No additional management support is needed unless otherwise documented below in the visit note. 

## 2014-02-01 NOTE — Patient Instructions (Signed)
Get your blood work before you leave     Please come back to the office in 1 year  for a physical exam. Come back fasting

## 2014-02-01 NOTE — Assessment & Plan Note (Signed)
Td 08-2010 Flu shot-- strongly declined  Labs Diet-exercise --counseled Female care per gyn

## 2014-02-01 NOTE — Assessment & Plan Note (Signed)
Found to have symptomatic cholelithiasis and elevated LFTs, she has now no symptoms, LFTs back to normal. GI recommended to see surgery and I concur. Explained the patient that she has gallbladder stones, they may cause serious problems in the future requiring emergency surgery. She again declined to be refer.

## 2015-02-08 ENCOUNTER — Telehealth: Payer: Self-pay | Admitting: Gastroenterology

## 2015-02-08 NOTE — Telephone Encounter (Signed)
The pt wanted to know who she was referred to after her visit last year for gallstones.  I did give her that information and the phone number to [facility], she will call them and discuss the options for an appt and surgery

## 2015-08-25 ENCOUNTER — Encounter (HOSPITAL_COMMUNITY): Payer: Self-pay

## 2015-08-25 ENCOUNTER — Emergency Department (HOSPITAL_COMMUNITY): Payer: BLUE CROSS/BLUE SHIELD

## 2015-08-25 ENCOUNTER — Emergency Department (HOSPITAL_COMMUNITY)
Admission: EM | Admit: 2015-08-25 | Discharge: 2015-08-26 | Disposition: A | Discharge status: Home / Self Care | Payer: BLUE CROSS/BLUE SHIELD | Attending: Emergency Medicine | Admitting: Emergency Medicine

## 2015-08-25 DIAGNOSIS — R1110 Vomiting, unspecified: Secondary | ICD-10-CM | POA: Insufficient documentation

## 2015-08-25 DIAGNOSIS — R109 Unspecified abdominal pain: Secondary | ICD-10-CM

## 2015-08-25 DIAGNOSIS — R1031 Right lower quadrant pain: Secondary | ICD-10-CM | POA: Diagnosis not present

## 2015-08-25 DIAGNOSIS — R61 Generalized hyperhidrosis: Secondary | ICD-10-CM | POA: Insufficient documentation

## 2015-08-25 LAB — COMPREHENSIVE METABOLIC PANEL
ALT: 94 U/L — ABNORMAL HIGH (ref 14–54)
AST: 23 U/L (ref 15–41)
Albumin: 4.3 g/dL (ref 3.5–5.0)
Alkaline Phosphatase: 65 U/L (ref 38–126)
Anion gap: 4 — ABNORMAL LOW (ref 5–15)
BUN: 16 mg/dL (ref 6–20)
CO2: 27 mmol/L (ref 22–32)
Calcium: 9.3 mg/dL (ref 8.9–10.3)
Chloride: 103 mmol/L (ref 101–111)
Creatinine, Ser: 0.84 mg/dL (ref 0.44–1.00)
GFR calc Af Amer: >60 mL/min (ref >60)
GFR calc non Af Amer: >60 mL/min (ref >60)
Glucose, Bld: 83 mg/dL (ref 65–99)
Potassium: 4 mmol/L (ref 3.5–5.1)
Sodium: 134 mmol/L — ABNORMAL LOW (ref 135–145)
Total Bilirubin: 0.7 mg/dL (ref 0.3–1.2)
Total Protein: 7.8 g/dL (ref 6.5–8.1)

## 2015-08-25 LAB — URINALYSIS, ROUTINE W REFLEX MICROSCOPIC
Bilirubin Urine: NEGATIVE
Glucose, UA: NEGATIVE mg/dL
Hgb urine dipstick: NEGATIVE
Ketones, ur: NEGATIVE mg/dL
Leukocytes, UA: NEGATIVE
Nitrite: NEGATIVE
Protein, ur: NEGATIVE mg/dL
Specific Gravity, Urine: 1.014 (ref 1.005–1.030)
pH: 5.5 (ref 5.0–8.0)

## 2015-08-25 LAB — CBC
HCT: 37.7 % (ref 36.0–46.0)
Hemoglobin: 12.7 g/dL (ref 12.0–15.0)
MCH: 29.7 pg (ref 26.0–34.0)
MCHC: 33.7 g/dL (ref 30.0–36.0)
MCV: 88.1 fL (ref 78.0–100.0)
Platelets: 232 10*3/uL (ref 150–400)
RBC: 4.28 MIL/uL (ref 3.87–5.11)
RDW: 13.9 % (ref 11.5–15.5)
WBC: 6.8 10*3/uL (ref 4.0–10.5)

## 2015-08-25 LAB — I-STAT BETA HCG BLOOD, ED (MC, WL, AP ONLY): I-stat hCG, quantitative: <5 m[IU]/mL (ref <5)

## 2015-08-25 LAB — LIPASE, BLOOD: Lipase: 42 U/L (ref 11–51)

## 2015-08-25 MED ORDER — TRAMADOL HCL 50 MG PO TABS
50.0000 mg | ORAL_TABLET | Freq: Four times a day (QID) | ORAL | Status: DC | PRN
Start: 2015-08-25 — End: 2015-11-13

## 2015-08-25 NOTE — ED Notes (Signed)
Pt with rt lower back radiating to rt anterior abdominal pain. Since Sunday.  Emesis.  Seen by MD and told to get probiotics.  Not helping.  No fever.  No burning with urination or vaginal pain.

## 2015-08-25 NOTE — Discharge Instructions (Signed)

## 2015-08-25 NOTE — ED Provider Notes (Signed)
CSN: [id number]     Arrival date & time 08/25/15  1706 History  By signing my name below, I, Eustaquio Maize, attest that this documentation has been prepared under the direction and in the presence of Virgel Manifold, MD. Electronically Signed: Eustaquio Maize, ED Scribe. 08/25/2015. 8:12 PM.   Chief Complaint  Patient presents with  . Back Pain  . Abdominal Pain   The history is provided by the patient. No language interpreter was used.    HPI Comments: [name] [name] is a 35 y.o. female who presents to the Emergency Department complaining of sudden onset, intermittent, severe, left mid back pain radiating to bilateral lower abdominal pain x 5 days. When the pain comes on patient begins feeling like her abdomen is bloated and has diaphoresis. Pt also complains of vomiting. She reports that the pain is mildly alleviated after vomiting. She has taken Tylenol and applied a warm compress with mild relief. No suspicious food intake but pt mentions eating pinto beans and cashew nuts prior to the pain beginning. She believes that the pain comes on after eating food. She had similar symptoms 2 years ago after eating [employer]. She was seen by her PCP at that time and was told to take probiotics which have been giving her relief. Pt has not had similar pain since then. Denies dysuria, hematuria, difficulty urinating, fever, diarrhea, constipation, or any other associated symptoms. Previous cesarean and tubal ligation.   Past Medical History  Diagnosis Date  . Multiple allergies     chronic sneezing  . Uterine fibroid   . Gestational diabetes   . Vaginal Pap smear, abnormal   . AMA (advanced maternal age) multigravida 10+   . Sickle cell trait ([place])   . Cholelithiases 11-2013    elevated LFTs, declined see surgery   Past Surgical History  Procedure Laterality Date  . Leg surgery      --R--from absecss with reconstructive flap- 2004  . Leg discrepancy    . Cesarean section with bilateral tubal  ligation Bilateral 05/29/2013    Procedure: CESAREAN SECTION WITH BILATERAL TUBAL LIGATION;  Surgeon: Luz Lex, MD;  Location: [facility];  Service: Obstetrics;  Laterality: Bilateral;  PRIMARY EDC 4/30   Family History  Problem Relation Age of Onset  . Diabetes Other     GM  . Coronary artery disease Neg Hx   . Colon cancer Neg Hx   . Breast cancer Neg Hx   . Stroke Neg Hx    Social History  Substance Use Topics  . Smoking status: Never Smoker   . Smokeless tobacco: Never Used  . Alcohol Use: No   OB History    Gravida Para Term Preterm AB TAB SAB Ectopic Multiple Living   4 4 4  0 0 0 0 0 0 4     Review of Systems  Constitutional: Positive for diaphoresis.  Gastrointestinal: Positive for vomiting, abdominal pain and abdominal distention. Negative for diarrhea and constipation.  Musculoskeletal: Positive for back pain.  All other systems reviewed and are negative.  Allergies  Cephalexin; Fluviral; and Ivp dye  Home Medications   Prior to Admission medications   Medication Sig Start Date End Date Taking? Authorizing Provider  Prenatal Vit-Fe Fumarate-FA (PRENATAL MULTIVITAMIN) TABS Take 1 tablet by mouth daily.    Historical Provider, MD   BP 102/68 mmHg  Pulse 77  Temp(Src) 98.2 F (36.8 C) (Oral)  Resp 20  SpO2 100%  LMP 08/01/2015   Physical Exam  Constitutional:  She is oriented to person, place, and time. She appears well-developed and well-nourished. No distress.  HENT:  Head: Normocephalic and atraumatic.  Eyes: EOM are normal.  Neck: Normal range of motion.  Cardiovascular: Normal rate, regular rhythm and normal heart sounds.   Pulmonary/Chest: Effort normal and breath sounds normal.  Abdominal: Soft. She exhibits no distension. There is tenderness.  Mild lower abdominal tenderness No CVA tenderness  Musculoskeletal: Normal range of motion.  Neurological: She is alert and oriented to person, place, and time.  Skin: Skin is warm and dry.  Psychiatric:  She has a normal mood and affect. Judgment normal.  Nursing note and vitals reviewed.   ED Course  Procedures (including critical care time)  DIAGNOSTIC STUDIES: Oxygen Saturation is 100% on RA, normal by my interpretation.    COORDINATION OF CARE: 8:10 PM-Discussed treatment plan which includes CT A/P with pt at bedside and pt agreed to plan.   Labs Review Labs Reviewed  COMPREHENSIVE METABOLIC PANEL - Abnormal; Notable for the following:    Sodium 134 (*)    ALT 94 (*)    Anion gap 4 (*)    All other components within normal limits  LIPASE, BLOOD  CBC  URINALYSIS, ROUTINE W REFLEX MICROSCOPIC (NOT AT [facility])  I-STAT BETA HCG BLOOD, ED ([facility], [facility], [facility] ONLY)    Imaging Review No results found. I have personally reviewed and evaluated these images and lab results as part of my medical decision-making.   EKG Interpretation None      MDM   Final diagnoses:  Flank pain   35yF with L mid back pain radiating into flank/abdomen. Very uncomfortable initially but declining pain medication. Subsequently improving although still persistent. First impression that likely kidney stone. UA without hematuria though. Will image.    I personally preformed the services scribed in my presence. The recorded information has been reviewed is accurate. Virgel Manifold, MD.      Virgel Manifold, MD 08/28/15 [phone]

## 2015-08-26 NOTE — ED Provider Notes (Signed)
Care was taken over from Dr. Wilson Singer had pending CT scan. CT scan showed evidence of gallstones but otherwise was unremarkable. Patient is afebrile. There is no white count. No other suggestions of cholecystitis. I was getting ready to discuss the findings with the patient but she was inadvertently discharged by the nurse prior to my discharging the patient. Discharge instructions have been previously printed by Dr. Wilson Singer. The nurse states that patient was aware of the gallstones. This doesn't seem to be the etiology for her symptoms as the pain was more lower in nature. She is planning on following up with her PCP per the discharging nurse.  Malvin Johns, MD 08/26/15 [phone]

## 2015-10-19 ENCOUNTER — Telehealth: Payer: Self-pay | Admitting: Gastroenterology

## 2015-10-19 NOTE — Telephone Encounter (Signed)
Pt has been scheduled for office visit and has been added to the wait list.  She was also notified that she could try PCP for sooner appt.

## 2015-10-22 IMAGING — US US ABDOMEN COMPLETE
1 series · 13 of 25 positions shown · non-contrast
Comparison: None.

CLINICAL DATA: Elevated liver enzymes; intermittent right upper
quadrant pain

EXAM:
ULTRASOUND ABDOMEN COMPLETE

[Series 1: us abdomen complete · 0.27mm/px · 13 of 64 slices shown]
[im 1/64]
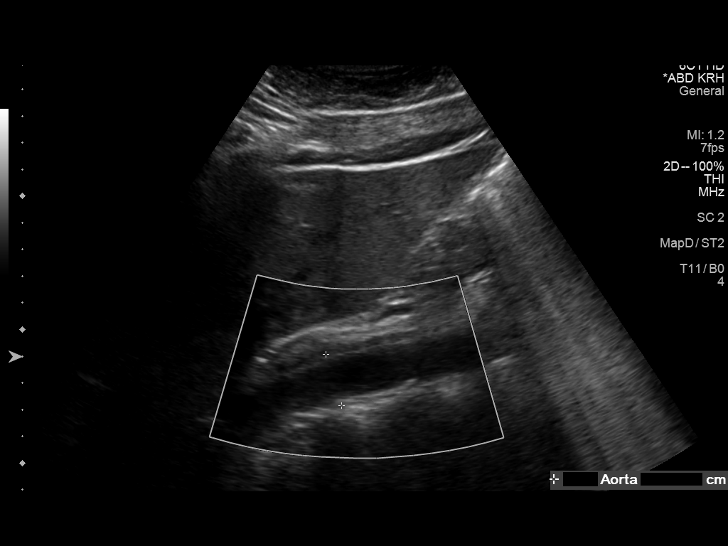
[im 6/64]
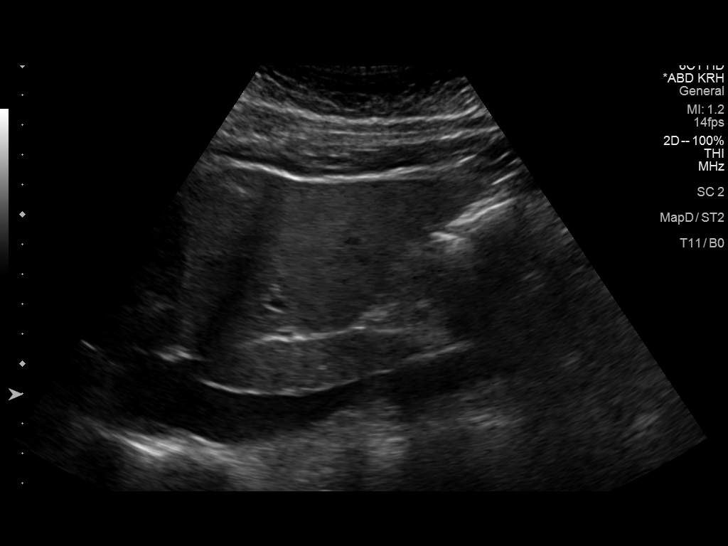
[im 11/64]
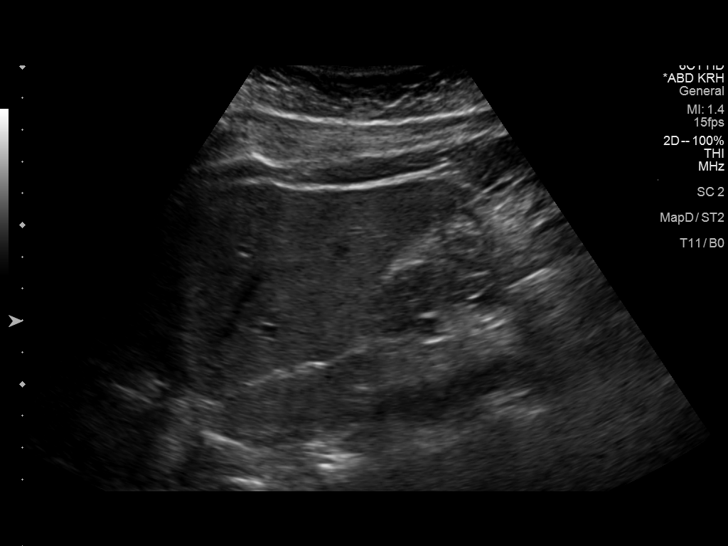
[im 16/64]
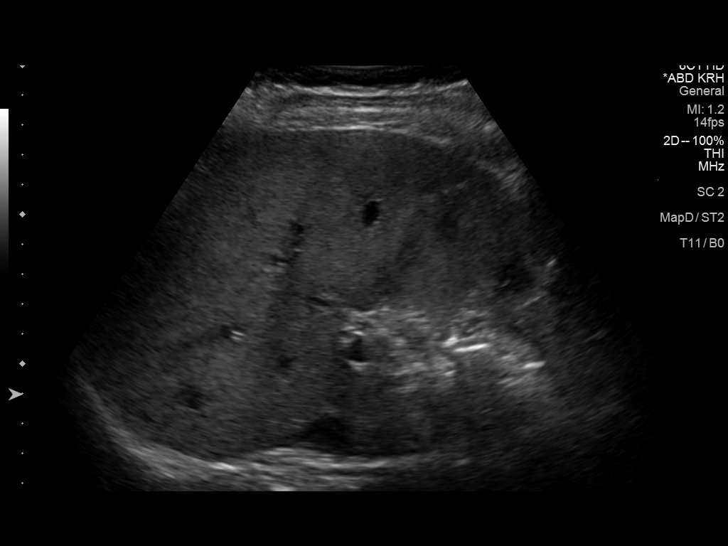
[im 22/64]
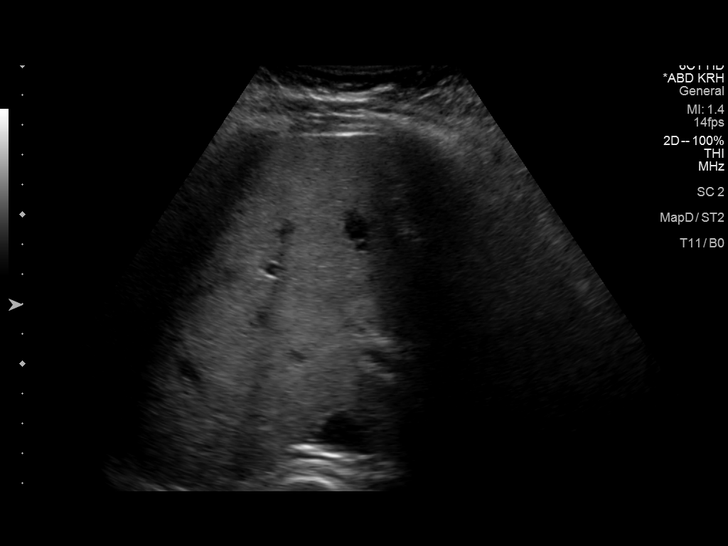
[im 27/64]
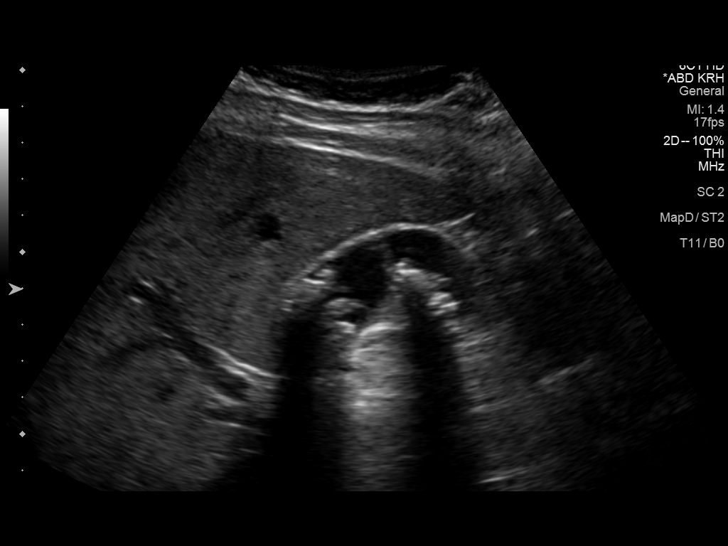
[im 32/64]
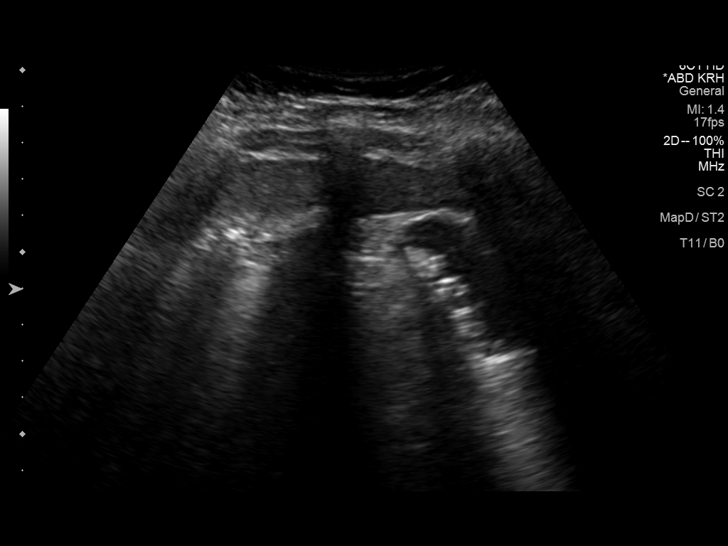
[im 37/64]
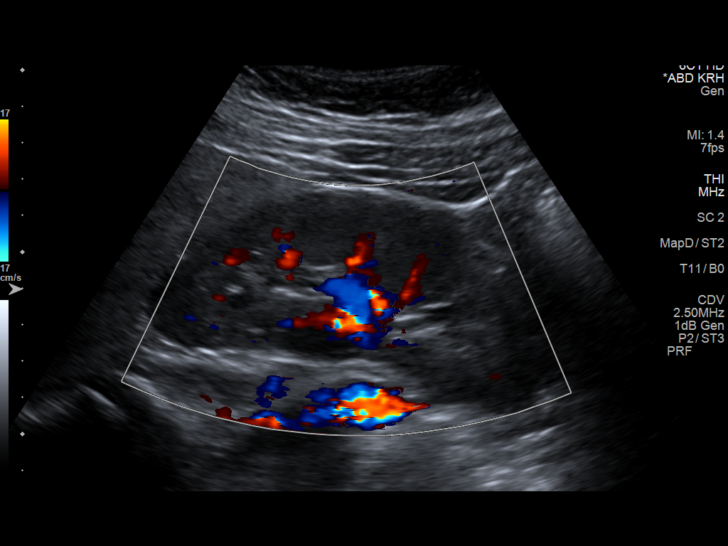
[im 43/64]
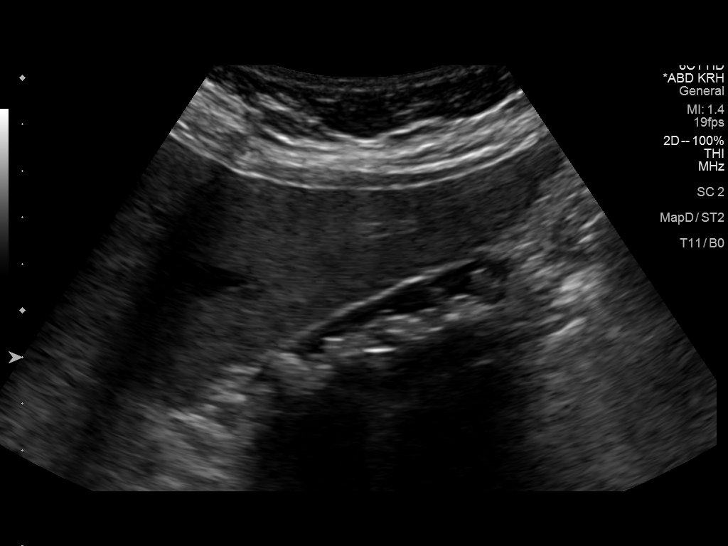
[im 48/64]
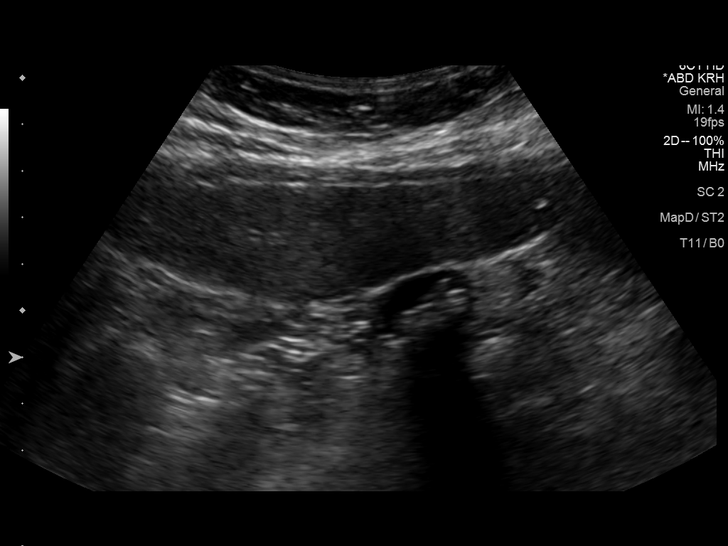
[im 53/64]
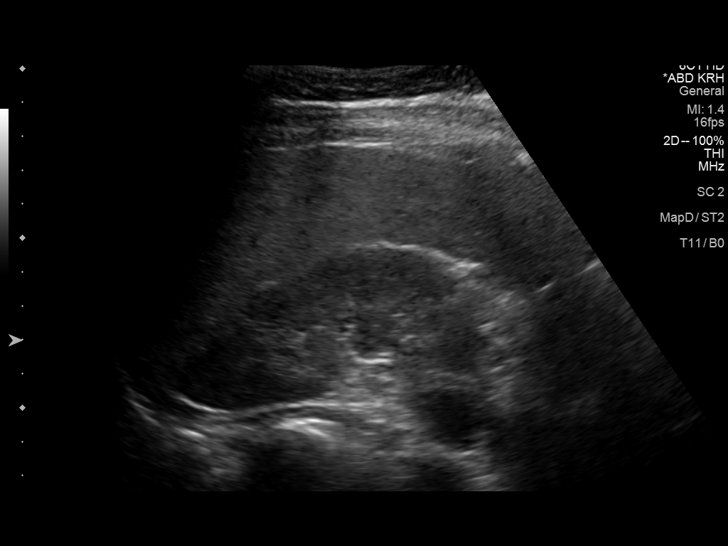
[im 58/64]
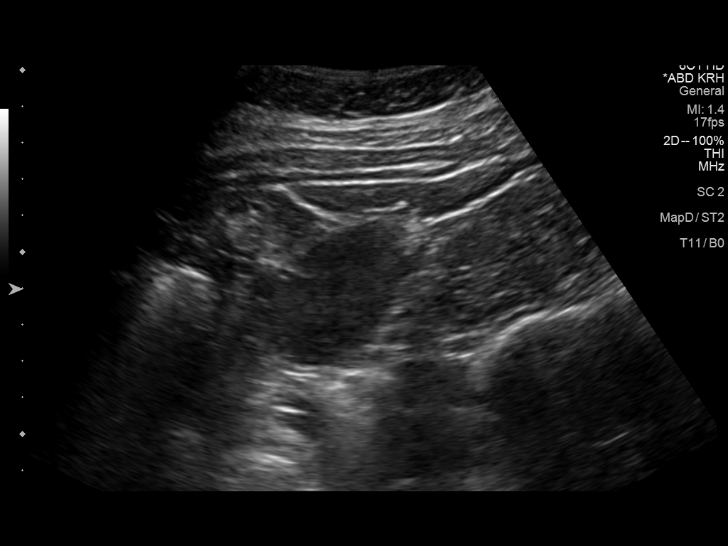
[im 64/64]
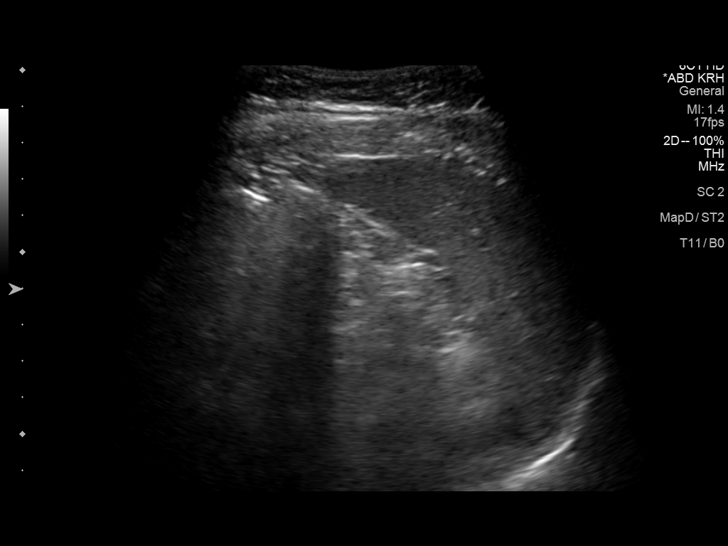

[13 of 25 positions shown; findings below may reference images not displayed]

FINDINGS: Gallbladder: Within the gallbladder, there are multiple echogenic
foci which move and shadow consistent with gallstones. Largest
individual gallstone measures 1.0 cm in length. There is no
gallbladder wall thickening or pericholecystic fluid. No sonographic
Murphy sign noted.

Common bile duct: Diameter: 1 mm. There is no intrahepatic, common
hepatic, or common bile duct dilatation.

Liver: No focal lesion identified. Liver echogenicity is mildly
increased.

IVC: No abnormality visualized.

Pancreas: No mass or inflammatory focus.

Spleen: Size and appearance within normal limits.

Right Kidney: Length: 11.2 cm. Echogenicity within normal limits. No
mass or hydronephrosis visualized.

Left Kidney: Length: 11.6 cm. Echogenicity within normal limits. No
mass or hydronephrosis visualized.

Abdominal aorta: No aneurysm visualized.

Other findings: No demonstrable ascites.
IMPRESSION: Cholelithiasis. Slight increase in overall liver echotexture. This
finding is probably due to hepatic steatosis. While no focal liver
lesions are identified, it must be cautioned that the sensitivity of
ultrasound for focal liver lesions is diminished in this
circumstance. Study otherwise unremarkable.

## 2015-11-13 ENCOUNTER — Ambulatory Visit (INDEPENDENT_AMBULATORY_CARE_PROVIDER_SITE_OTHER): Payer: BLUE CROSS/BLUE SHIELD | Admitting: Internal Medicine

## 2015-11-13 ENCOUNTER — Encounter: Payer: Self-pay | Admitting: Internal Medicine

## 2015-11-13 VITALS — BP 124/80 | HR 67 | Temp 98.0°F | Resp 14 | Ht 68.0 in | Wt 207.5 lb

## 2015-11-13 DIAGNOSIS — Z0000 Encounter for general adult medical examination without abnormal findings: Secondary | ICD-10-CM | POA: Diagnosis not present

## 2015-11-13 DIAGNOSIS — K8020 Calculus of gallbladder without cholecystitis without obstruction: Secondary | ICD-10-CM

## 2015-11-13 DIAGNOSIS — Z8632 Personal history of gestational diabetes: Secondary | ICD-10-CM

## 2015-11-13 NOTE — Progress Notes (Signed)
Subjective:    Patient ID: [name] [name], female    DOB: June 01, 1980, 35 y.o.   MRN: [id number]  DOS:  11/13/2015 Type of visit - description : cpx Interval history: Went to the ER 08-2015 with abdominal pain, one of the LFTs was a slightly elevated, CT show no acute findings, did confirm cholelithiasis. Her main concern today is abdominal pain which happen once or twice a year, today she described the pain as usually postprandial, "the whole abdomen  hurts", some radiation to the back, may last few hours, usually associated with nausea, decreased by vomiting. Probiotics seem to help    Review of Systems  Constitutional: No fever. No chills. No unexplained wt changes. No unusual sweats  HEENT: No dental problems, no ear discharge, no facial swelling, no voice changes. No eye discharge, no eye  redness , no  intolerance to light   Respiratory: No wheezing , no  difficulty breathing. No cough , no mucus production  Cardiovascular: No CP, no leg swelling , no  Palpitations  GI:  No blood in the stools. No dysphagia, no odynophagia    Endocrine: No polyphagia, no polyuria , no polydipsia  GU: No dysuria, gross hematuria, difficulty urinating. No urinary urgency, no frequency.  Musculoskeletal: No joint swellings or unusual aches or pains  Skin: No change in the color of the skin, palor , no  Rash  Allergic, immunologic: No environmental allergies , no  food allergies  Neurological: No dizziness no  syncope. No headaches. No diplopia, no slurred, no slurred speech, no motor deficits, no facial  Numbness  Hematological: No enlarged lymph nodes, no easy bruising , no unusual bleedings  Psychiatry: No suicidal ideas, no hallucinations, no beavior problems, no confusion.  No unusual/severe anxiety, no depression  Past Medical History:  Diagnosis Date  . AMA (advanced maternal age) multigravida 87+   . Cholelithiases 11-2013   elevated LFTs, declined see surgery  . Gestational  diabetes   . Multiple allergies    chronic sneezing  . Sickle cell trait ([place])   . Uterine fibroid   . Vaginal Pap smear, abnormal     Past Surgical History:  Procedure Laterality Date  . CESAREAN SECTION WITH BILATERAL TUBAL LIGATION Bilateral 05/29/2013   Procedure: CESAREAN SECTION WITH BILATERAL TUBAL LIGATION;  Surgeon: Luz Lex, MD;  Location: [facility];  Service: Obstetrics;  Laterality: Bilateral;  PRIMARY EDC 4/30  . leg discrepancy    . LEG SURGERY     --R--from absecss with reconstructive flap- 2004    Social History   Social History  . Marital status: Married    Spouse name: N/A  . Number of children: 3  . Years of education: N/A   Occupational History  . 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 Smoking status: Never Smoker  . Smokeless tobacco: Never Used  . Alcohol use No  . Drug use: No  . Sexual activity: Not Currently   Other Topics Concern  . Not on file   Social History Narrative   Original from [place]      Family History  Problem Relation Age of Onset  . Diabetes Maternal Grandmother   . Coronary artery disease Neg Hx   . Colon cancer Neg Hx   . Breast cancer Neg Hx   . Stroke Neg Hx        Medication List       Accurate as of 11/13/15  3:40 PM. Always  use your most recent med list.          traMADol 50 MG tablet Commonly known as:  ULTRAM Take 1 tablet (50 mg total) by mouth every 6 (six) hours as needed.          Objective:   Physical Exam BP 124/80 (BP Location: Left Arm, Patient Position: Sitting, Cuff Size: Normal)   Pulse 67   Temp 98 F (36.7 C) (Oral)   Resp 14   Ht 5\' 8"  (1.727 m)   Wt 207 lb 8 oz (94.1 kg)   SpO2 97%   Breastfeeding? No   BMI 31.55 kg/m   General:   Well developed, well nourished . NAD.  Neck: No  thyromegaly  HEENT:  Normocephalic . Face symmetric, atraumatic Lungs:  CTA B Normal respiratory effort, no intercostal retractions, no accessory muscle use. Heart: RRR,   no murmur.  No pretibial edema bilaterally  Abdomen:  Not distended, soft, non-tender. No rebound or rigidity.   Skin: Exposed areas without rash. Not pale. Not jaundice Neurologic:  alert & oriented X3.  Speech normal, gait appropriate for age and unassisted Strength symmetric and appropriate for age.  Psych: Cognition and judgment appear intact.  Cooperative with normal attention span and concentration.  Behavior appropriate. No anxious or depressed appearing.    Assessment & Plan:   Assessment H/o Gestational diabetes Allergies Sickle cell trait 2015: abd pain --> elevated LFTs --> US GB stones --> MRCP nl biliar tree, + GB stones , saw GI 11-2013, see note  PLAN: Recurrent abdominal pain: See HPI, concerned about pain, last episode was 08-2015 (see ER eval). He has an appointment pending to see GI ;acutually has 2 appointments : to see Dr Ardis Hughs and another GI (refered by gyn) . While I agree w/ the GI eval, I advised patient that in light of cholelithiasis and episodes of abdominal pain is appropriate to see a surgeon and discuss the case with them . A number of questions were answered to the best of my ability, she eventually agreed to be referred to general surgery. If she has severe/recurrent pain needs to contact me or go to the ER RTC one year

## 2015-11-13 NOTE — Progress Notes (Signed)
Pre visit review using our clinic review tool, if applicable. No additional management support is needed unless otherwise documented below in the visit note. 

## 2015-11-13 NOTE — Assessment & Plan Note (Addendum)
Td 08-2010; declined flu shot , benefits discussed  Female care -- per Dr Gaetano Net CCS- not indicated  Labs: CMP, FLP, Aic (H/o gestational diabetes) Diet-exercise discussed , states is doing well

## 2015-11-13 NOTE — Patient Instructions (Signed)
GO TO THE FRONT DESK Schedule your next appointment for a  Physical in 1 year   Schedule labs to be done this week fasting

## 2015-11-14 DIAGNOSIS — Z09 Encounter for follow-up examination after completed treatment for conditions other than malignant neoplasm: Secondary | ICD-10-CM | POA: Insufficient documentation

## 2015-11-14 NOTE — Assessment & Plan Note (Signed)
Recurrent abdominal pain: See HPI, concerned about pain, last episode was 08-2015 (see ER eval). He has an appointment pending to see GI ;acutually has 2 appointments : to see Dr Ardis Hughs and another GI (refered by gyn) . While I agree w/ the GI eval, I advised patient that in light of cholelithiasis and episodes of abdominal pain is appropriate to see a surgeon and discuss the case with them . A number of questions were answered to the best of my ability, she eventually agreed to be referred to general surgery. If she has severe/recurrent pain needs to contact me or go to the ER RTC one year

## 2015-11-15 ENCOUNTER — Other Ambulatory Visit (INDEPENDENT_AMBULATORY_CARE_PROVIDER_SITE_OTHER): Payer: BLUE CROSS/BLUE SHIELD

## 2015-11-15 DIAGNOSIS — Z0000 Encounter for general adult medical examination without abnormal findings: Secondary | ICD-10-CM | POA: Diagnosis not present

## 2015-11-15 DIAGNOSIS — Z8632 Personal history of gestational diabetes: Secondary | ICD-10-CM

## 2015-11-15 LAB — LIPID PANEL
Cholesterol: 130 mg/dL (ref 0–200)
HDL: 44 mg/dL (ref >39.00)
LDL CALC: 79 mg/dL (ref 0–99)
NonHDL: 85.85
TRIGLYCERIDES: 35 mg/dL (ref 0.0–149.0)
Total CHOL/HDL Ratio: 3
VLDL: 7 mg/dL (ref 0.0–40.0)

## 2015-11-15 LAB — COMPREHENSIVE METABOLIC PANEL
ALBUMIN: 3.6 g/dL (ref 3.5–5.2)
ALK PHOS: 52 U/L (ref 39–117)
ALT: 17 U/L (ref 0–35)
AST: 20 U/L (ref 0–37)
BUN: 10 mg/dL (ref 6–23)
CALCIUM: 9.1 mg/dL (ref 8.4–10.5)
CHLORIDE: 106 meq/L (ref 96–112)
CO2: 28 mEq/L (ref 19–32)
Creatinine, Ser: 0.78 mg/dL (ref 0.40–1.20)
GFR: 106.54 mL/min (ref >60.00)
Glucose, Bld: 116 mg/dL — ABNORMAL HIGH (ref 70–99)
POTASSIUM: 3.9 meq/L (ref 3.5–5.1)
SODIUM: 139 meq/L (ref 135–145)
Total Bilirubin: 0.3 mg/dL (ref 0.2–1.2)
Total Protein: 7.1 g/dL (ref 6.0–8.3)

## 2015-11-15 LAB — HEMOGLOBIN A1C: Hgb A1c MFr Bld: 5.3 % (ref 4.6–6.5)

## 2015-12-04 ENCOUNTER — Telehealth: Payer: Self-pay | Admitting: Gastroenterology

## 2015-12-07 ENCOUNTER — Telehealth: Payer: Self-pay | Admitting: Gastroenterology

## 2015-12-07 NOTE — Telephone Encounter (Signed)
Pt was advised that she has gallstones and was referred to a surgeon to discuss gallbladder removal in 2015.  She declined to have appt at that time.  She states she wants to see her PCP and will call back with any other questions

## 2015-12-08 ENCOUNTER — Other Ambulatory Visit (HOSPITAL_COMMUNITY): Payer: Self-pay | Admitting: General Surgery

## 2015-12-08 DIAGNOSIS — K8020 Calculus of gallbladder without cholecystitis without obstruction: Secondary | ICD-10-CM

## 2015-12-08 NOTE — Telephone Encounter (Signed)
The pt is very anxious about seeing an surgeon we discussed at length about gallstones and I tried to explain that she can consult with a surgeon and discuss her fears, the appt would be a consult initially not surgery.  She will call back after she thinks about this again.

## 2015-12-20 ENCOUNTER — Encounter (HOSPITAL_COMMUNITY)
Admission: RE | Admit: 2015-12-20 | Discharge: 2015-12-20 | Disposition: A | Discharge status: Home / Self Care | Payer: BLUE CROSS/BLUE SHIELD | Source: Ambulatory Visit | Attending: General Surgery | Admitting: General Surgery

## 2015-12-20 DIAGNOSIS — K8020 Calculus of gallbladder without cholecystitis without obstruction: Secondary | ICD-10-CM

## 2015-12-20 MED ORDER — TECHNETIUM TC 99M MEBROFENIN IV KIT
5.0000 | PACK | Freq: Once | INTRAVENOUS | Status: AC | PRN
  Administered 2015-12-20: 5 via INTRAVENOUS

## 2016-01-03 ENCOUNTER — Encounter: Payer: Self-pay | Admitting: Gastroenterology

## 2016-01-03 ENCOUNTER — Ambulatory Visit (INDEPENDENT_AMBULATORY_CARE_PROVIDER_SITE_OTHER): Payer: BLUE CROSS/BLUE SHIELD | Admitting: Gastroenterology

## 2016-01-03 VITALS — BP 98/70 | HR 80 | Ht 67.0 in | Wt 206.1 lb

## 2016-01-03 DIAGNOSIS — R1013 Epigastric pain: Secondary | ICD-10-CM | POA: Diagnosis not present

## 2016-01-03 DIAGNOSIS — K8020 Calculus of gallbladder without cholecystitis without obstruction: Secondary | ICD-10-CM

## 2016-01-03 NOTE — Patient Instructions (Addendum)
Will communicate with Dr. Rosendo Gros about what seems to be symptomatic gallstone disease.  If you are age 37 or older, your body mass index should be between 23-30. Your Body mass index is 32.28 kg/m. If this is out of the aforementioned range listed, please consider follow up with your Primary Care Provider.  If you are age 69 or younger, your body mass index should be between 19-25. Your Body mass index is 32.28 kg/m. If this is out of the aformentioned range listed, please consider follow up with your Primary Care Provider.

## 2016-01-03 NOTE — Progress Notes (Signed)
Review of pertinent gastrointestinal problems: 1. Symptomatic gallstone disease; ultrasound 2015 proved cholelithiasis. She has intermittent epigastric pains, intermittent right upper quadrant pains that are presumed biliary in origin. She had an episode of significant abdominal pains associated with significant elevation in her liver tests (tbili 1.4, AST 714, ALT 1152, transiently (LFTS much improved 4 days later and completely normal 2 weeks later).  Follow up MRCP showed no CBD stones. This was presumed to be biliary compromise from gallstone disease. She was referred to a surgeon in 2015 but never went.   2017 CT scan showed cholelithiasis. 2017 HIDA scan was normal, normal gallbladder ejection fraction    HPI: This is a pleasant 35 year old woman whom I last saw about 2 years ago  Chief complaint is intermittent epigastric pains, cholelithiasis  She has intermittent abd pains that radiate to to her back. + associated with nausea/vomiting.  She has had three episodes of epigastric abd pains that radiate to her back.  + nausea at the same time.  The pains last 34min to 1 hour.    She eventually saw Dr. Rosendo Gros.  He was sent for HIDA and it was normal.  ROS: complete GI ROS as described in HPI.  Constitutional:  No unintentional weight loss   Past Medical History:  Diagnosis Date  . AMA (advanced maternal age) multigravida 35+   . Cholelithiases 11/2013   elevated LFTs  . Gestational diabetes   . Multiple allergies    chronic sneezing  . Sickle cell trait ([place])   . Uterine fibroid   . Vaginal Pap smear, abnormal     Past Surgical History:  Procedure Laterality Date  . CESAREAN SECTION WITH BILATERAL TUBAL LIGATION Bilateral 05/29/2013   Procedure: CESAREAN SECTION WITH BILATERAL TUBAL LIGATION;  Surgeon: Luz Lex, MD;  Location: [facility];  Service: Obstetrics;  Laterality: Bilateral;  PRIMARY EDC 4/30  . leg discrepancy    . LEG SURGERY     --R--from absecss with  reconstructive flap- 2004    No current outpatient prescriptions on file.   No current facility-administered medications for this visit.     Allergies as of 01/03/2016 - Review Complete 01/03/2016  Allergen Reaction Noted  . Cephalexin  02/03/2008  . Fluviral [flu virus vaccine] Other (See Comments) 09/07/2012  . Ivp dye [iodinated diagnostic agents]  11/29/2013    Family History  Problem Relation Age of Onset  . Diabetes Maternal Grandmother   . Coronary artery disease Neg Hx   . Colon cancer Neg Hx   . Breast cancer Neg Hx   . Stroke Neg Hx     Social History   Social History  . Marital status: Married    Spouse name: N/A  . Number of children: 4  . Years of education: N/A   Occupational History  . works at [employer], Administrator    Social History Main Topics  . Smoking status: Never Smoker  . Smokeless tobacco: Never Used  . Alcohol use No  . Drug use: No  . Sexual activity: Not Currently   Other Topics Concern  . Not on file   Social History Narrative   Original from [place]      Physical Exam: BP 98/70 (BP Location: Left Arm, Patient Position: Sitting, Cuff Size: Normal)   Pulse 80   Ht 5\' 7"  (1.702 m)   Wt 206 lb 2 oz (93.5 kg)   LMP 12/20/2015   BMI 32.28 kg/m  Constitutional: generally well-appearing Psychiatric: alert and  oriented x3 Abdomen: soft, nontender, nondistended, no obvious ascites, no peritoneal signs, normal bowel sounds No peripheral edema noted in lower extremities  Assessment and plan: 35 y.o. female with What I believed to be symptomatic gallstone disease  She has had 3 or 4 episodes of significant epigastric pain that radiates to her back associated with some nausea and vomiting. This is been over about 2-3 years. During one of these episodes she had significantly transiently elevated liver tests. These came back down to normal within days. She has gallstones in her gallbladder, multiple. I think her symptoms are related to  symptomatic gallstone disease. I'm going to make it with her general surgeon about this.   Owens Loffler, MD [facility] 01/03/2016, 9:07 AM

## 2016-06-19 ENCOUNTER — Emergency Department (HOSPITAL_BASED_OUTPATIENT_CLINIC_OR_DEPARTMENT_OTHER)
Admission: EM | Admit: 2016-06-19 | Discharge: 2016-06-19 | Disposition: A | Discharge status: Home / Self Care | Payer: Worker's Compensation | Attending: Emergency Medicine | Admitting: Emergency Medicine

## 2016-06-19 ENCOUNTER — Encounter (HOSPITAL_BASED_OUTPATIENT_CLINIC_OR_DEPARTMENT_OTHER): Payer: Self-pay

## 2016-06-19 ENCOUNTER — Emergency Department (HOSPITAL_BASED_OUTPATIENT_CLINIC_OR_DEPARTMENT_OTHER): Payer: Worker's Compensation

## 2016-06-19 DIAGNOSIS — Y939 Activity, unspecified: Secondary | ICD-10-CM | POA: Insufficient documentation

## 2016-06-19 DIAGNOSIS — Y929 Unspecified place or not applicable: Secondary | ICD-10-CM | POA: Diagnosis not present

## 2016-06-19 DIAGNOSIS — Y999 Unspecified external cause status: Secondary | ICD-10-CM | POA: Insufficient documentation

## 2016-06-19 DIAGNOSIS — W109XXA Fall (on) (from) unspecified stairs and steps, initial encounter: Secondary | ICD-10-CM | POA: Insufficient documentation

## 2016-06-19 DIAGNOSIS — S92154A Nondisplaced avulsion fracture (chip fracture) of right talus, initial encounter for closed fracture: Secondary | ICD-10-CM

## 2016-06-19 DIAGNOSIS — S99921A Unspecified injury of right foot, initial encounter: Secondary | ICD-10-CM | POA: Diagnosis present

## 2016-06-19 DIAGNOSIS — W19XXXA Unspecified fall, initial encounter: Secondary | ICD-10-CM

## 2016-06-19 MED ORDER — IBUPROFEN 600 MG PO TABS: 600.0000 mg | ORAL_TABLET | Freq: Four times a day (QID) | ORAL | 0 refills | Status: DC | PRN

## 2016-06-19 MED ORDER — TRAMADOL HCL 50 MG PO TABS
50.0000 mg | ORAL_TABLET | Freq: Once | ORAL | Status: AC
  Administered 2016-06-19: 50 mg via ORAL
  Filled 2016-06-19: qty 1

## 2016-06-19 MED ORDER — TRAMADOL HCL 50 MG PO TABS: 50.0000 mg | ORAL_TABLET | Freq: Four times a day (QID) | ORAL | 0 refills | Status: DC | PRN

## 2016-06-19 MED ORDER — IBUPROFEN 200 MG PO TABS
600.0000 mg | ORAL_TABLET | Freq: Once | ORAL | Status: AC
  Administered 2016-06-19: 600 mg via ORAL
  Filled 2016-06-19: qty 1

## 2016-06-19 NOTE — Discharge Instructions (Signed)
Rest - please stay off ankle as much as possible Ice - ice for 20 minutes at a time, several times a day Compression - wear brace to provide support Elevate - elevate ankle above level of heart Ibuprofen - take with food. Take up to 3-4 times daily Follow up with your doctor or Orthopedics

## 2016-06-19 NOTE — ED Notes (Signed)
ED Provider at bedside. 

## 2016-06-19 NOTE — ED Provider Notes (Signed)
[facility] DEPT [facility] Provider Note   CSN: [id number] Arrival date & time: 06/19/16  1559  By signing my name below, I, [name] [name], attest that this documentation has been prepared under the direction and in the presence of [employer], PA-C. Electronically Signed: Dora [name], Scribe. 06/19/2016. 4:21 PM.  History   Chief Complaint Chief Complaint  Patient presents with  . Fall   The history is provided by the patient. No language interpreter was used.    HPI Comments: [name] [name] is a 36 y.o. female who presents to the Emergency Department complaining of constant right lower extremity pain s/p a mechanical fall that occurred shortly PTA. Patient reports she fell down a few stairs and injured her right lower extremity. No head trauma or LOC. She states her most significant pain is in her right knee and right foot. Patient has had significant difficulty ambulating due to the severity of her right lower extremity pain and endorses pain exacerbation with weight bearing. No alleviating factors noted. Patient has a h/o skin graft surgery on her right lower leg and reports some chronic swelling to this area since the operation. She denies any acute joint swelling, numbness/tingling, wounds, bruising, or any other associated symptoms.  Past Medical History:  Diagnosis Date  . AMA (advanced maternal age) multigravida 35+   . Cholelithiases 11/2013   elevated LFTs  . Gestational diabetes   . Multiple allergies    chronic sneezing  . Sickle cell trait ([place])   . Uterine fibroid   . Vaginal Pap smear, abnormal     Patient Active Problem List   Diagnosis Date Noted  . PCP NOTES >>>>>>>>>>>>>>>>>. 11/14/2015  . Symptomatic cholelithiasis 11/28/2013  . Nosebleed 10/29/2013  . Recurrent UTI 10/29/2013  . Cesarean delivery delivered 05/29/2013  . Fibroids 03/04/2012  . Annual physical exam 08/31/2010  . RHINITIS, ALLERGIC NOS 09/09/2006    Past Surgical History:  Procedure  Laterality Date  . CESAREAN SECTION WITH BILATERAL TUBAL LIGATION Bilateral 05/29/2013   Procedure: CESAREAN SECTION WITH BILATERAL TUBAL LIGATION;  Surgeon: Luz Lex, MD;  Location: [facility];  Service: Obstetrics;  Laterality: Bilateral;  PRIMARY EDC 4/30  . leg discrepancy    . LEG SURGERY     --R--from absecss with reconstructive flap- 2004    OB History    Gravida Para Term Preterm AB Living   4 4 4  0 0 4   SAB TAB Ectopic Multiple Live Births   0 0 0 0 4       Home Medications    Prior to Admission medications   Not on File    Family History Family History  Problem Relation Age of Onset  . Diabetes Maternal Grandmother   . Coronary artery disease Neg Hx   . Colon cancer Neg Hx   . Breast cancer Neg Hx   . Stroke Neg Hx     Social History Social History  Substance Use Topics  . Smoking status: Never Smoker  . Smokeless tobacco: Never Used  . Alcohol use No     Allergies   Cephalexin; Fluviral [flu virus vaccine]; and Ivp dye [iodinated diagnostic agents]   Review of Systems Review of Systems  Musculoskeletal: Positive for arthralgias and gait problem. Negative for joint swelling.  Skin: Negative for color change and wound.  Neurological: Negative for syncope.   Physical Exam Updated Vital Signs BP 110/68 (BP Location: Right Arm)   Pulse 89   Temp 98.9 F (37.2 C) (Oral)  Resp 18   Ht 5\' 7"  (1.702 m)   Wt 200 lb (90.7 kg)   LMP 05/22/2016   SpO2 100%   BMI 31.32 kg/m   Physical Exam  Constitutional: She is oriented to person, place, and time. She appears well-developed and well-nourished. No distress.  HENT:  Head: Normocephalic and atraumatic.  Eyes: Conjunctivae and EOM are normal.  Neck: Neck supple. No tracheal deviation present.  Cardiovascular: Intact distal pulses.   Pulmonary/Chest: Effort normal. No respiratory distress.  Abdominal: She exhibits no distension.  Musculoskeletal: She exhibits tenderness.  Right lower extremity:  Tenderness over the medial aspect of the knee. No obvious swelling of knee. Pain with ROM of the knee. There is an old surgical scar to the mid-shin with chronic swelling. Diffuse tenderness to the ankle and foot. Neurovascularly intact.  Neurological: She is alert and oriented to person, place, and time.  Skin: Skin is warm and dry.  Psychiatric: She has a normal mood and affect. Her behavior is normal.  Nursing note and vitals reviewed.  ED Treatments / Results  Labs (all labs ordered are listed, but only abnormal results are displayed) Labs Reviewed - No data to display  EKG  EKG Interpretation None       Radiology Dg Ankle Complete Right  Result Date: 06/19/2016 CLINICAL DATA:  Fall today with right ankle pain. EXAM: RIGHT ANKLE - COMPLETE 3+ VIEW COMPARISON:  None. FINDINGS: Small curvilinear osseous density adjacent to the anterior dorsal talus. The ankle mortise is preserved. Osseous remottling of the mid tibial shaft and fibular shaft with adjacent surgical clips. No definite tibial talar joint effusion. No discrete focal soft tissue abnormality. IMPRESSION: Small curvilinear osseous density adjacent to the dorsal anterior talus. This may be an acute avulsion fracture, recommend correlation with point tenderness. Alternatively, chronic calcification related to capsular insertion can have a similar appearance. Remote posttraumatic/postsurgical change in the mid tibia/fibula, partially included. Electronically Signed   By: Jeb Levering M.D.   On: 06/19/2016 17:38   Dg Knee Complete 4 Views Right  Result Date: 06/19/2016 CLINICAL DATA:  Fall today with right knee pain. EXAM: RIGHT KNEE - COMPLETE 4+ VIEW COMPARISON:  None. FINDINGS: No acute fracture or subluxation. Joint spaces are preserved. Undulated osseous remottling of the proximal tibial diaphysis, may be remote posttraumatic or postsurgical. Tiny superior patellar and lateral tibia from spurs. No knee joint effusion. No focal  soft tissue abnormality. IMPRESSION: 1. No acute fracture or subluxation of the right knee. 2. Minimal early degenerative change with peripheral spurring. 3. Osseous remottling of the proximal tibia may be remote posttraumatic or postsurgical. Electronically Signed   By: Jeb Levering M.D.   On: 06/19/2016 17:34   Dg Foot Complete Right  Result Date: 06/19/2016 CLINICAL DATA:  Fall today with right foot pain. EXAM: RIGHT FOOT COMPLETE - 3+ VIEW COMPARISON:  None. FINDINGS: Small curvilinear osseous density adjacent to the dorsal anterior talus. There is otherwise no evidence of fracture. The alignment is maintained. No definite focal soft tissue abnormality. IMPRESSION: Small curvilinear osseous density adjacent to the dorsal anterior talus. This may be an acute avulsion fracture, recommend correlation with point tenderness. Alternatively, chronic calcification related to capsular insertion can have a similar appearance. Electronically Signed   By: Jeb Levering M.D.   On: 06/19/2016 17:37    Procedures Procedures (including critical care time)  DIAGNOSTIC STUDIES: Oxygen Saturation is 100% on RA, normal by my interpretation.    COORDINATION OF CARE: 4:26 PM Discussed  treatment plan with pt at bedside and pt agreed to plan.  Medications Ordered in ED Medications  ibuprofen (ADVIL,MOTRIN) tablet 600 mg (600 mg Oral Given 06/19/16 1705)  traMADol (ULTRAM) tablet 50 mg (50 mg Oral Given 06/19/16 1705)     Initial Impression / Assessment and Plan / ED Course  I have reviewed the triage vital signs and the nursing notes.  Pertinent labs & imaging results that were available during my care of the patient were reviewed by me and considered in my medical decision making (see chart for details).  36 year old female with knee, ankle, foot pain after a mechanical fall earlier today. X-ray remarkable for possible avulsion fracture of the talus. Will place patient in cam walker and provided crutches.  Discussed rice protocol and follow-up with orthopedics if symptoms persist. Return precautions given.  Final Clinical Impressions(s) / ED Diagnoses   Final diagnoses:  Closed nondisplaced avulsion fracture of right talus, initial encounter  Fall, initial encounter    New Prescriptions New Prescriptions   No medications on file   I personally performed the services described in this documentation, which was scribed in my presence. The recorded information has been reviewed and is accurate.    Recardo Evangelist, PA-C 06/19/16 Verlee Rossetti, MD 06/23/16 [phone]

## 2016-06-19 NOTE — ED Notes (Signed)
Patient transported to X-ray 

## 2016-06-19 NOTE — ED Triage Notes (Signed)
Pt states she fell approx 30 min PTA-pain to right LE from knee to ankle-NAD-presents to triage in w/c

## 2016-06-24 ENCOUNTER — Ambulatory Visit: Payer: Worker's Compensation | Admitting: Family Medicine

## 2016-06-25 ENCOUNTER — Ambulatory Visit: Payer: Worker's Compensation | Admitting: Family Medicine

## 2016-06-25 ENCOUNTER — Encounter (INDEPENDENT_AMBULATORY_CARE_PROVIDER_SITE_OTHER): Payer: Self-pay | Admitting: Orthopaedic Surgery

## 2016-06-25 ENCOUNTER — Telehealth: Payer: Self-pay | Admitting: Internal Medicine

## 2016-06-25 ENCOUNTER — Ambulatory Visit (INDEPENDENT_AMBULATORY_CARE_PROVIDER_SITE_OTHER): Payer: Worker's Compensation | Admitting: Orthopaedic Surgery

## 2016-06-25 DIAGNOSIS — S92154A Nondisplaced avulsion fracture (chip fracture) of right talus, initial encounter for closed fracture: Secondary | ICD-10-CM

## 2016-06-25 DIAGNOSIS — S92151A Displaced avulsion fracture (chip fracture) of right talus, initial encounter for closed fracture: Secondary | ICD-10-CM

## 2016-06-25 DIAGNOSIS — M25561 Pain in right knee: Secondary | ICD-10-CM | POA: Insufficient documentation

## 2016-06-25 NOTE — Telephone Encounter (Signed)
Caller name: Makiya Relation to pt: self Call back number: [phone] Pharmacy:  Reason for call: Pt called wanting to have a ER fu appt for today 06-25-2016, pt was informed PCP was 100% booked and was offered for next day or any day of this wk but pt states does not have availability for other days and only for this day (06-25-2016). Pt wanted to let provider know and see what arrangements can be made for her appt. Please advise.

## 2016-06-25 NOTE — Telephone Encounter (Signed)
ED note reviewed, Pt needed to f/u with Ortho ASAP.  She has fracture of R ankle/foot no need to see PCP. Urgent referral placed to [facility].

## 2016-06-25 NOTE — Telephone Encounter (Signed)
Pt was called and informed the below, pt stated already got a call from Ortho, it was set up today with Ortho at 10:30. Pt appreciated all the effort from our office and wanted Dr Larose Kells, nurse and Staff to know that she appreciated all the help.

## 2016-06-25 NOTE — Progress Notes (Signed)
Office Visit Note   Patient: [name] [name]           Date of Birth: Dec 22, 1980           MRN: [id number] Visit Date: 06/25/2016              Requested by: Colon Branch, [address] [place], [place] [address] PCP: Colon Branch, MD   Assessment & Plan: Visit Diagnoses:  1. Acute pain of right knee   2. Displaced avulsion fracture (chip fracture) of right talus, initial encounter for closed fracture     Plan: We will have her work from home for the next 4 weeks. She may wean the Cam Walker and crutches as needed. Impression of the knee is an MCL sprain. Hinged knee brace was given. I did probe it as needed. Ice as needed. Rest as needed. Follow-up in 4 weeks for recheck.  Follow-Up Instructions: Return in about 4 weeks (around 07/23/2016).   Orders:  No orders of the defined types were placed in this encounter.  No orders of the defined types were placed in this encounter.     Procedures: No procedures performed   Clinical Data: No additional findings.   Subjective: Chief Complaint  Patient presents with  . Right Ankle - Pain, Fracture  . Right Foot - Pain, Fracture    Patient is a 36 year old female sustained a work-related injury recently in follow-up today. She is complaining more about her knee pain in her ankle pain. She did sustain a talus avulsion fracture. She is ambulating in a Cam Walker and crutches. She complains of medial sided knee pain. Denies any swelling.    Review of Systems  Constitutional: Negative.   HENT: Negative.   Eyes: Negative.   Respiratory: Negative.   Cardiovascular: Negative.   Endocrine: Negative.   Musculoskeletal: Negative.   Neurological: Negative.   Hematological: Negative.   Psychiatric/Behavioral: Negative.   All other systems reviewed and are negative.    Objective: Vital Signs: There were no vitals taken for this visit.  Physical Exam  Constitutional: She is oriented to person, place, and time.  She appears well-developed and well-nourished.  HENT:  Head: Normocephalic and atraumatic.  Eyes: EOM are normal.  Neck: Neck supple.  Pulmonary/Chest: Effort normal.  Abdominal: Soft.  Neurological: She is alert and oriented to person, place, and time.  Skin: Skin is warm. Capillary refill takes less than 2 seconds.  Psychiatric: She has a normal mood and affect. Her behavior is normal. Judgment and thought content normal.  Nursing note and vitals reviewed.   Ortho Exam Right knee exam shows no joint effusion. She is tender over the MCL. Collaterals and cruciate's are stable. She has worsening pain with extension of the knee. Right ankle exam shows mild swelling. She has tenderness over the talar neck. Otherwise exam is benign.  Specialty Comments:  No specialty comments available.  Imaging: No results found.   PMFS History: Patient Active Problem List   Diagnosis Date Noted  . Displaced avulsion fracture (chip fracture) of right talus, initial encounter for closed fracture 06/25/2016  . Acute pain of right knee 06/25/2016  . PCP NOTES >>>>>>>>>>>>>>>>>. 11/14/2015  . Symptomatic cholelithiasis 11/28/2013  . Nosebleed 10/29/2013  . Recurrent UTI 10/29/2013  . Cesarean delivery delivered 05/29/2013  . Fibroids 03/04/2012  . Annual physical exam 08/31/2010  . RHINITIS, ALLERGIC NOS 09/09/2006   Past Medical History:  Diagnosis Date  . AMA (advanced  maternal age) multigravida 35+   . Cholelithiases 11/2013   elevated LFTs  . Gestational diabetes   . Multiple allergies    chronic sneezing  . Sickle cell trait ([place])   . Uterine fibroid   . Vaginal Pap smear, abnormal     Family History  Problem Relation Age of Onset  . Diabetes Maternal Grandmother   . Coronary artery disease Neg Hx   . Colon cancer Neg Hx   . Breast cancer Neg Hx   . Stroke Neg Hx     Past Surgical History:  Procedure Laterality Date  . CESAREAN SECTION WITH BILATERAL TUBAL LIGATION Bilateral  05/29/2013   Procedure: CESAREAN SECTION WITH BILATERAL TUBAL LIGATION;  Surgeon: Luz Lex, MD;  Location: [facility];  Service: Obstetrics;  Laterality: Bilateral;  PRIMARY EDC 4/30  . leg discrepancy    . 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 Smoking status: Never Smoker  . Smokeless tobacco: Never Used  . Alcohol use No  . Drug use: No  . Sexual activity: Not on file

## 2016-06-26 ENCOUNTER — Telehealth (INDEPENDENT_AMBULATORY_CARE_PROVIDER_SITE_OTHER): Payer: Self-pay

## 2016-06-26 NOTE — Telephone Encounter (Signed)
Faxed the 06/25/16 office and work note to [facility] adjustor

## 2016-07-22 ENCOUNTER — Ambulatory Visit (INDEPENDENT_AMBULATORY_CARE_PROVIDER_SITE_OTHER): Payer: Self-pay | Admitting: Orthopaedic Surgery

## 2016-07-23 ENCOUNTER — Ambulatory Visit (INDEPENDENT_AMBULATORY_CARE_PROVIDER_SITE_OTHER): Payer: Self-pay | Admitting: Orthopaedic Surgery

## 2016-07-23 ENCOUNTER — Ambulatory Visit (INDEPENDENT_AMBULATORY_CARE_PROVIDER_SITE_OTHER): Payer: Worker's Compensation | Admitting: Orthopaedic Surgery

## 2016-07-23 ENCOUNTER — Encounter (INDEPENDENT_AMBULATORY_CARE_PROVIDER_SITE_OTHER): Payer: Self-pay | Admitting: Orthopaedic Surgery

## 2016-07-23 DIAGNOSIS — G8929 Other chronic pain: Secondary | ICD-10-CM

## 2016-07-23 DIAGNOSIS — M25561 Pain in right knee: Secondary | ICD-10-CM | POA: Diagnosis not present

## 2016-07-23 NOTE — Progress Notes (Signed)
Office Visit Note   Patient: [name] [name]           Date of Birth: September 05, 1980           MRN: [id number] Visit Date: 07/23/2016              Requested by: Colon Branch, [address] [place], [place] [address] PCP: Colon Branch, MD   Assessment & Plan: Visit Diagnoses:  1. Chronic pain of right knee     Plan: At this point recommend MRI of the right knee to rule out structural malady. She does not appear to be getting any better. I'll see her back after the MRI. Total face to face encounter time was greater than 25 minutes and over half of this time was spent in counseling and/or coordination of care.  Follow-Up Instructions: Return in about 3 weeks (around 08/13/2016).   Orders:  Orders Placed This Encounter  Procedures  . MR Knee Right w/o contrast   No orders of the defined types were placed in this encounter.     Procedures: No procedures performed   Clinical Data: No additional findings.   Subjective: Chief Complaint  Patient presents with  . Right Knee - Pain, Follow-up  . Right Foot - Follow-up    Patient follows up today for her right knee pain. She is doing better in terms of the foot. She still endorse some swelling. She denies any pain in her foot. Right knee still continues to hurt and swell. She is working from home. She is using a hinged knee brace. The pain does not radiate.    Review of Systems  Constitutional: Negative.   HENT: Negative.   Eyes: Negative.   Respiratory: Negative.   Cardiovascular: Negative.   Endocrine: Negative.   Musculoskeletal: Negative.   Neurological: Negative.   Hematological: Negative.   Psychiatric/Behavioral: Negative.   All other systems reviewed and are negative.    Objective: Vital Signs: There were no vitals taken for this visit.  Physical Exam  Constitutional: She is oriented to person, place, and time. She appears well-developed and well-nourished.  HENT:  Head: Normocephalic and  atraumatic.  Eyes: EOM are normal.  Neck: Neck supple.  Pulmonary/Chest: Effort normal.  Abdominal: Soft.  Neurological: She is alert and oriented to person, place, and time.  Skin: Skin is warm. Capillary refill takes less than 2 seconds.  Psychiatric: She has a normal mood and affect. Her behavior is normal. Judgment and thought content normal.  Nursing note and vitals reviewed.   Ortho Exam Right knee exam shows no joint effusion. She does have medial sided tenderness palpation. MCL testing is grossly intact. Specialty Comments:  No specialty comments available.  Imaging: No results found.   PMFS History: Patient Active Problem List   Diagnosis Date Noted  . Displaced avulsion fracture (chip fracture) of right talus, initial encounter for closed fracture 06/25/2016  . Acute pain of right knee 06/25/2016  . PCP NOTES >>>>>>>>>>>>>>>>>. 11/14/2015  . Symptomatic cholelithiasis 11/28/2013  . Nosebleed 10/29/2013  . Recurrent UTI 10/29/2013  . Cesarean delivery delivered 05/29/2013  . Fibroids 03/04/2012  . Annual physical exam 08/31/2010  . RHINITIS, ALLERGIC NOS 09/09/2006   Past Medical History:  Diagnosis Date  . AMA (advanced maternal age) multigravida 78+   . Cholelithiases 11/2013   elevated LFTs  . Gestational diabetes   . Multiple allergies    chronic sneezing  . Sickle cell trait ([place])   .  Uterine fibroid   . Vaginal Pap smear, abnormal     Family History  Problem Relation Age of Onset  . Diabetes Maternal Grandmother   . Coronary artery disease Neg Hx   . Colon cancer Neg Hx   . Breast cancer Neg Hx   . Stroke Neg Hx     Past Surgical History:  Procedure Laterality Date  . CESAREAN SECTION WITH BILATERAL TUBAL LIGATION Bilateral 05/29/2013   Procedure: CESAREAN SECTION WITH BILATERAL TUBAL LIGATION;  Surgeon: Luz Lex, MD;  Location: [facility];  Service: Obstetrics;  Laterality: Bilateral;  PRIMARY EDC 4/30  . leg discrepancy    . 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 Smoking status: Never Smoker  . Smokeless tobacco: Never Used  . Alcohol use No  . Drug use: No  . Sexual activity: Not on file

## 2016-07-30 ENCOUNTER — Telehealth (INDEPENDENT_AMBULATORY_CARE_PROVIDER_SITE_OTHER): Payer: Self-pay

## 2016-07-30 NOTE — Telephone Encounter (Signed)
Received call from [place] from [facility] needing a script for MRI fax over to them so can get patient scheduled for MRI. I sent over the referral detail to [phone] to attn UZO

## 2016-08-15 ENCOUNTER — Ambulatory Visit (INDEPENDENT_AMBULATORY_CARE_PROVIDER_SITE_OTHER): Payer: Self-pay | Admitting: Orthopaedic Surgery

## 2016-08-20 ENCOUNTER — Encounter (INDEPENDENT_AMBULATORY_CARE_PROVIDER_SITE_OTHER): Payer: Self-pay | Admitting: Orthopaedic Surgery

## 2016-08-20 ENCOUNTER — Ambulatory Visit (INDEPENDENT_AMBULATORY_CARE_PROVIDER_SITE_OTHER): Payer: Self-pay | Admitting: Orthopaedic Surgery

## 2016-08-20 ENCOUNTER — Ambulatory Visit (INDEPENDENT_AMBULATORY_CARE_PROVIDER_SITE_OTHER): Payer: Worker's Compensation | Admitting: Orthopaedic Surgery

## 2016-08-20 DIAGNOSIS — M25561 Pain in right knee: Secondary | ICD-10-CM

## 2016-08-20 DIAGNOSIS — G8929 Other chronic pain: Secondary | ICD-10-CM | POA: Diagnosis not present

## 2016-08-20 NOTE — Progress Notes (Addendum)
Office Visit Note   Patient: [name] [name]           Date of Birth: 13-Mar-1980           MRN: [id number] Visit Date: 08/20/2016              Requested by: Colon Branch, [address] [place], [place] [address] PCP: Colon Branch, MD   Assessment & Plan: Visit Diagnoses:  1. Chronic pain of right knee     Plan: At this point patient is released back to work Half of the time in the office in half of the time from home.  We will put her in physical therapy for evaluation and treatment for 6 weeks.  I would anticipate that she'll be able to return to regular duty after 64 weeks of physical therapy. Questions encouraged and answered. Follow-up with me as needed. Total face to face encounter time was greater than 25 minutes and over half of this time was spent in counseling and/or coordination of care.  Follow-Up Instructions: Return if symptoms worsen or fail to improve.   Orders:  No orders of the defined types were placed in this encounter.  No orders of the defined types were placed in this encounter.     Procedures: No procedures performed   Clinical Data: No additional findings.   Subjective: Chief Complaint  Patient presents with  . Right Knee - Pain    Patient is a 36 year old female comes in for review her MRI. She is doing much better. She still has some discomfort in her right knee and ankle.    Review of Systems  Constitutional: Negative.   HENT: Negative.   Eyes: Negative.   Respiratory: Negative.   Cardiovascular: Negative.   Endocrine: Negative.   Musculoskeletal: Negative.   Neurological: Negative.   Hematological: Negative.   Psychiatric/Behavioral: Negative.   All other systems reviewed and are negative.    Objective: Vital Signs: There were no vitals taken for this visit.  Physical Exam  Constitutional: She is oriented to person, place, and time. She appears well-developed and well-nourished.  Pulmonary/Chest: Effort  normal.  Neurological: She is alert and oriented to person, place, and time.  Skin: Skin is warm. Capillary refill takes less than 2 seconds.  Psychiatric: She has a normal mood and affect. Her behavior is normal. Judgment and thought content normal.  Nursing note and vitals reviewed.   Ortho Exam Right knee exam shows no joint effusion. MCL is slightly tender. Anterior cruciate ligament is stable.  Right ankle exam stable. Specialty Comments:  No specialty comments available.  Imaging: No results found.   PMFS History: Patient Active Problem List   Diagnosis Date Noted  . Displaced avulsion fracture (chip fracture) of right talus, initial encounter for closed fracture 06/25/2016  . Acute pain of right knee 06/25/2016  . PCP NOTES >>>>>>>>>>>>>>>>>. 11/14/2015  . Symptomatic cholelithiasis 11/28/2013  . Nosebleed 10/29/2013  . Recurrent UTI 10/29/2013  . Cesarean delivery delivered 05/29/2013  . Fibroids 03/04/2012  . Annual physical exam 08/31/2010  . RHINITIS, ALLERGIC NOS 09/09/2006   Past Medical History:  Diagnosis Date  . AMA (advanced maternal age) multigravida 56+   . Cholelithiases 11/2013   elevated LFTs  . Gestational diabetes   . Multiple allergies    chronic sneezing  . Sickle cell trait ([place])   . Uterine fibroid   . Vaginal Pap smear, abnormal     Family History  Problem Relation Age of Onset  . Diabetes Maternal Grandmother   . Coronary artery disease Neg Hx   . Colon cancer Neg Hx   . Breast cancer Neg Hx   . Stroke Neg Hx     Past Surgical History:  Procedure Laterality Date  . CESAREAN SECTION WITH BILATERAL TUBAL LIGATION Bilateral 05/29/2013   Procedure: CESAREAN SECTION WITH BILATERAL TUBAL LIGATION;  Surgeon: Luz Lex, MD;  Location: [facility];  Service: Obstetrics;  Laterality: Bilateral;  PRIMARY EDC 4/30  . leg discrepancy    . LEG SURGERY     --R--from absecss with reconstructive flap- 2004   Social History   Occupational  History  . works at [employer], Administrator    Social History Main Topics  . Smoking status: Never Smoker  . Smokeless tobacco: Never Used  . Alcohol use No  . Drug use: No  . Sexual activity: Not on file

## 2016-08-21 ENCOUNTER — Telehealth (INDEPENDENT_AMBULATORY_CARE_PROVIDER_SITE_OTHER): Payer: Self-pay

## 2016-08-21 NOTE — Telephone Encounter (Signed)
Patient would like a call back.  Cb# is [phone].  Please advise.  Thank you.

## 2016-08-22 NOTE — Telephone Encounter (Signed)
Called patient to advise yesterday. She wanted for me to print out the work note for her.

## 2016-09-16 ENCOUNTER — Encounter (INDEPENDENT_AMBULATORY_CARE_PROVIDER_SITE_OTHER): Payer: Self-pay | Admitting: Orthopaedic Surgery

## 2016-09-16 ENCOUNTER — Ambulatory Visit (INDEPENDENT_AMBULATORY_CARE_PROVIDER_SITE_OTHER): Payer: Worker's Compensation | Admitting: Orthopaedic Surgery

## 2016-09-16 DIAGNOSIS — M25561 Pain in right knee: Secondary | ICD-10-CM | POA: Diagnosis not present

## 2016-09-16 DIAGNOSIS — G8929 Other chronic pain: Secondary | ICD-10-CM | POA: Diagnosis not present

## 2016-09-16 NOTE — Progress Notes (Signed)
Office Visit Note   Patient: [name] [name]           Date of Birth: 1981/01/17           MRN: [id number] Visit Date: 09/16/2016              Requested by: Colon Branch, [address] [place], [place] [address] PCP: Colon Branch, MD   Assessment & Plan: Visit Diagnoses:  1. Chronic pain of right knee     Plan: Patient continues to have significant pain that is disabling her from recovering at a expected rate. I think she is had issues psychologically with going back to work and having to deal with the injury simultaneously. At this point given her continued severe pain I think an repeat MRI of her right knee is indicated to assess for healing. Out of work until we have the MRI to review. Total face to face encounter time was greater than 25 minutes and over half of this time was spent in counseling and/or coordination of care.  Follow-Up Instructions: Return if symptoms worsen or fail to improve.   Orders:  No orders of the defined types were placed in this encounter.  No orders of the defined types were placed in this encounter.     Procedures: No procedures performed   Clinical Data: No additional findings.   Subjective: Chief Complaint  Patient presents with  . Right Knee - Pain, Follow-up    Patient comes back today for follow-up for her right knee pain. She states she continues to have 10 out of 10 pain. He states that she is able to work but she feels like she needs to concentrate solely on rehabilitation. Physical therapy is causing her discomfort only on the medial aspect of the knee. She denies any numbness or tingling in his symptoms.    Review of Systems   Objective: Vital Signs: There were no vitals taken for this visit.  Physical Exam  Ortho Exam Right knee exam shows stable MCL. No joint effusion. Normal range of motion. Collaterals and cruciates are stable. Specialty Comments:  No specialty comments available.  Imaging: No  results found.   PMFS History: Patient Active Problem List   Diagnosis Date Noted  . Displaced avulsion fracture (chip fracture) of right talus, initial encounter for closed fracture 06/25/2016  . Acute pain of right knee 06/25/2016  . PCP NOTES >>>>>>>>>>>>>>>>>. 11/14/2015  . Symptomatic cholelithiasis 11/28/2013  . Nosebleed 10/29/2013  . Recurrent UTI 10/29/2013  . Cesarean delivery delivered 05/29/2013  . Fibroids 03/04/2012  . Annual physical exam 08/31/2010  . RHINITIS, ALLERGIC NOS 09/09/2006   Past Medical History:  Diagnosis Date  . AMA (advanced maternal age) multigravida 77+   . Cholelithiases 11/2013   elevated LFTs  . Gestational diabetes   . Multiple allergies    chronic sneezing  . Sickle cell trait ([place])   . Uterine fibroid   . Vaginal Pap smear, abnormal     Family History  Problem Relation Age of Onset  . Diabetes Maternal Grandmother   . Coronary artery disease Neg Hx   . Colon cancer Neg Hx   . Breast cancer Neg Hx   . Stroke Neg Hx     Past Surgical History:  Procedure Laterality Date  . CESAREAN SECTION WITH BILATERAL TUBAL LIGATION Bilateral 05/29/2013   Procedure: CESAREAN SECTION WITH BILATERAL TUBAL LIGATION;  Surgeon: Luz Lex, MD;  Location: [facility];  Service: Obstetrics;  Laterality: Bilateral;  PRIMARY EDC 4/30  . leg discrepancy    . 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 Smoking status: Never Smoker  . Smokeless tobacco: Never Used  . Alcohol use No  . Drug use: No  . Sexual activity: Not on file

## 2016-09-16 NOTE — Addendum Note (Signed)
Addended by: Precious Bard on: 09/16/2016 11:27 AM   Modules accepted: Orders

## 2016-10-03 ENCOUNTER — Ambulatory Visit
Admission: RE | Admit: 2016-10-03 | Discharge: 2016-10-03 | Disposition: A | Discharge status: Home / Self Care | Payer: BLUE CROSS/BLUE SHIELD | Source: Ambulatory Visit | Attending: Orthopaedic Surgery | Admitting: Orthopaedic Surgery

## 2016-10-03 DIAGNOSIS — G8929 Other chronic pain: Secondary | ICD-10-CM

## 2016-10-03 DIAGNOSIS — M25561 Pain in right knee: Principal | ICD-10-CM

## 2016-10-15 ENCOUNTER — Encounter (INDEPENDENT_AMBULATORY_CARE_PROVIDER_SITE_OTHER): Payer: Self-pay | Admitting: Orthopaedic Surgery

## 2016-10-15 ENCOUNTER — Ambulatory Visit (INDEPENDENT_AMBULATORY_CARE_PROVIDER_SITE_OTHER): Payer: Worker's Compensation | Admitting: Orthopaedic Surgery

## 2016-10-15 DIAGNOSIS — G8929 Other chronic pain: Secondary | ICD-10-CM | POA: Diagnosis not present

## 2016-10-15 DIAGNOSIS — M25561 Pain in right knee: Secondary | ICD-10-CM

## 2016-10-15 NOTE — Progress Notes (Signed)
Office Visit Note   Patient: [name] [name]           Date of Birth: 06/20/1980           MRN: [id number] Visit Date: 10/15/2016              Requested by: Colon Branch, [address] [place], [place] [address] PCP: Colon Branch, MD   Assessment & Plan: Visit Diagnoses:  1. Chronic pain of right knee     Plan: MRI shows a mild MCL sprain without any disruption. She also has some mild degenerative changes that are chronic. These were discussed with the patient. At this point I will like her to do formal weeks of physical therapy after which I feel that she will be at [place]. Released to full duty. Questions encouraged and answered. Total face to face encounter time was greater than 25 minutes and over half of this time was spent in counseling and/or coordination of care.  Follow-Up Instructions: Return if symptoms worsen or fail to improve.   Orders:  No orders of the defined types were placed in this encounter.  No orders of the defined types were placed in this encounter.     Procedures: No procedures performed   Clinical Data: No additional findings.   Subjective: Chief Complaint  Patient presents with  . Right Knee - Pain, Follow-up    Patient comes in today for follow-up of her right knee MRI. Overall she is doing fine and has returned back to work. She just complains of some discomfort on the medial aspect of her knee.    Review of Systems   Objective: Vital Signs: There were no vitals taken for this visit.  Physical Exam  Ortho Exam Right knee exam shows no interval changes. MCL testing is stable. Specialty Comments:  No specialty comments available.  Imaging: No results found.   PMFS History: Patient Active Problem List   Diagnosis Date Noted  . Displaced avulsion fracture (chip fracture) of right talus, initial encounter for closed fracture 06/25/2016  . Acute pain of right knee 06/25/2016  . PCP NOTES >>>>>>>>>>>>>>>>>.  11/14/2015  . Symptomatic cholelithiasis 11/28/2013  . Nosebleed 10/29/2013  . Recurrent UTI 10/29/2013  . Cesarean delivery delivered 05/29/2013  . Fibroids 03/04/2012  . Annual physical exam 08/31/2010  . RHINITIS, ALLERGIC NOS 09/09/2006   Past Medical History:  Diagnosis Date  . AMA (advanced maternal age) multigravida 68+   . Cholelithiases 11/2013   elevated LFTs  . Gestational diabetes   . Multiple allergies    chronic sneezing  . Sickle cell trait ([place])   . Uterine fibroid   . Vaginal Pap smear, abnormal     Family History  Problem Relation Age of Onset  . Diabetes Maternal Grandmother   . Coronary artery disease Neg Hx   . Colon cancer Neg Hx   . Breast cancer Neg Hx   . Stroke Neg Hx     Past Surgical History:  Procedure Laterality Date  . CESAREAN SECTION WITH BILATERAL TUBAL LIGATION Bilateral 05/29/2013   Procedure: CESAREAN SECTION WITH BILATERAL TUBAL LIGATION;  Surgeon: Luz Lex, MD;  Location: [facility];  Service: Obstetrics;  Laterality: Bilateral;  PRIMARY EDC 4/30  . leg discrepancy    . 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 Smoking status: Never Smoker  . Smokeless tobacco: Never Used  . Alcohol use No  . Drug use: No  . Sexual activity: Not on file

## 2016-10-18 DIAGNOSIS — Z01419 Encounter for gynecological examination (general) (routine) without abnormal findings: Secondary | ICD-10-CM | POA: Diagnosis not present

## 2016-10-18 DIAGNOSIS — Z6832 Body mass index (BMI) 32.0-32.9, adult: Secondary | ICD-10-CM | POA: Diagnosis not present

## 2016-10-18 LAB — HM PAP SMEAR

## 2016-11-18 ENCOUNTER — Encounter: Payer: Self-pay | Admitting: Internal Medicine

## 2016-11-18 ENCOUNTER — Ambulatory Visit (INDEPENDENT_AMBULATORY_CARE_PROVIDER_SITE_OTHER): Payer: BLUE CROSS/BLUE SHIELD | Admitting: Internal Medicine

## 2016-11-18 VITALS — BP 118/68 | HR 71 | Temp 98.0°F | Resp 14 | Ht 67.0 in | Wt 214.1 lb

## 2016-11-18 DIAGNOSIS — Z0000 Encounter for general adult medical examination without abnormal findings: Secondary | ICD-10-CM | POA: Diagnosis not present

## 2016-11-18 NOTE — Progress Notes (Signed)
Pre visit review using our clinic review tool, if applicable. No additional management support is needed unless otherwise documented below in the visit note. 

## 2016-11-18 NOTE — Assessment & Plan Note (Signed)
-  Td 08-2010; declined flu shot -Female care -- per Dr Gaetano Net, recently seen per pt  -CCS- not indicated  Labs:  CMP, CBC, TSH, FLP -Diet and exercise discussed

## 2016-11-18 NOTE — Progress Notes (Signed)
Subjective:    Patient ID: [name] [name], female    DOB: May 16, 1980, 36 y.o.   MRN: [id number]  DOS:  11/18/2016 Type of visit - description : cpx Interval history: Somewhat concerned about wt gain, has not been as active lately due to knee pain which is now resolved. Plans to exercise more.  Wt Readings from Last 3 Encounters:  11/18/16 214 lb 2 oz (97.1 kg)  06/19/16 200 lb (90.7 kg)  01/03/16 206 lb 2 oz (93.5 kg)    Review of Systems  Other than above, a 14 point review of systems is negative   Past Medical History:  Diagnosis Date  . AMA (advanced maternal age) multigravida 70+   . Cholelithiases 11/2013   elevated LFTs  . Gestational diabetes   . Multiple allergies    chronic sneezing  . Sickle cell trait ([place])   . Uterine fibroid   . Vaginal Pap smear, abnormal     Past Surgical History:  Procedure Laterality Date  . CESAREAN SECTION WITH BILATERAL TUBAL LIGATION Bilateral 05/29/2013   Procedure: CESAREAN SECTION WITH BILATERAL TUBAL LIGATION;  Surgeon: Luz Lex, MD;  Location: [facility];  Service: Obstetrics;  Laterality: Bilateral;  PRIMARY EDC 4/30  . leg discrepancy    . LEG SURGERY     --R--from absecss with reconstructive flap- 2004    Social History   Social History  . Marital status: Married    Spouse name: N/A  . Number of children: 4  . Years of education: N/A   Occupational History  . works at [employer], Administrator    Social History Main Topics  . Smoking status: Never Smoker  . Smokeless tobacco: Never Used  . Alcohol use No  . Drug use: No  . Sexual activity: Not on file   Other Topics Concern  . Not on file   Social History Narrative   Original from [place]    Household- pt, husband , 4 children at home      Family History  Problem Relation Age of Onset  . Diabetes Maternal Grandmother   . Coronary artery disease Neg Hx   . Colon cancer Neg Hx   . Breast cancer Neg Hx   . Stroke Neg Hx      Allergies as of  11/18/2016      Reactions   Cephalexin    REACTION: Rash   Fluviral [flu Virus Vaccine] Other (See Comments)   unknown    Ivp Dye [iodinated Diagnostic Agents]       Medication List    as of 11/18/2016 11:59 PM   You have not been prescribed any medications.        Objective:   Physical Exam BP 118/68 (BP Location: Left Arm, Patient Position: Sitting, Cuff Size: Normal)   Pulse 71   Temp 98 F (36.7 C) (Oral)   Resp 14   Ht 5\' 7"  (1.702 m)   Wt 214 lb 2 oz (97.1 kg)   LMP 11/18/2016 (Exact Date)   SpO2 97%   BMI 33.54 kg/m   General:   Well developed, well nourished . NAD.  Neck: No  thyromegaly  HEENT:  Normocephalic . Face symmetric, atraumatic Lungs:  CTA B Normal respiratory effort, no intercostal retractions, no accessory muscle use. Heart: RRR,  no murmur.  No pretibial edema bilaterally  Abdomen:  Not distended, soft, non-tender. No rebound or rigidity.   Skin: Exposed areas without rash. Not pale. Not jaundice Neurologic:  alert & oriented X3.  Speech normal, gait appropriate for age and unassisted Strength symmetric and appropriate for age.  Psych: Cognition and judgment appear intact.  Cooperative with normal attention span and concentration.  Behavior appropriate. No anxious or depressed appearing.    Assessment & Plan:   Assessment H/o Gestational diabetes Allergies Sickle cell trait 2015: abd pain --> elevated LFTs --> US GB stones --> MRCP nl biliar tree, + GB stones , saw GI 11-2013, surgical referral failed ; saw GI 2017, then surgery 11-2015, HIDA (-), no surgery as off 11-2016   PLAN: Cholelithiasis: Since last year, saw GI and subsequently the surgeon, per surgical note she has been reluctant to have  surgery ; now states that she is pain-free and she feels well. Remind her that she does have gallbladder stones, she has the potential to get very sick from them, urged her to seek medical attention if she has symptoms. RTC one year

## 2016-11-18 NOTE — Patient Instructions (Signed)
  GO TO THE FRONT DESK Schedule fasting labs to be done this week  Schedule your next appointment for a  Physical exam in 1 year

## 2016-11-19 ENCOUNTER — Other Ambulatory Visit (INDEPENDENT_AMBULATORY_CARE_PROVIDER_SITE_OTHER): Payer: BLUE CROSS/BLUE SHIELD

## 2016-11-19 DIAGNOSIS — Z0000 Encounter for general adult medical examination without abnormal findings: Secondary | ICD-10-CM | POA: Diagnosis not present

## 2016-11-19 NOTE — Assessment & Plan Note (Signed)
Cholelithiasis: Since last year, saw GI and subsequently the surgeon, per surgical note she has been reluctant to have  surgery ; now states that she is pain-free and she feels well. Remind her that she does have gallbladder stones, she has the potential to get very sick from them, urged her to seek medical attention if she has symptoms. RTC one year

## 2016-11-20 LAB — CBC WITH DIFFERENTIAL/PLATELET
BASOS ABS: 0 10*3/uL (ref 0.0–0.1)
BASOS PCT: 0.7 % (ref 0.0–3.0)
EOS ABS: 0.1 10*3/uL (ref 0.0–0.7)
Eosinophils Relative: 2.2 % (ref 0.0–5.0)
HEMATOCRIT: 36.6 % (ref 36.0–46.0)
HEMOGLOBIN: 12.1 g/dL (ref 12.0–15.0)
Lymphocytes Relative: 52.3 % — ABNORMAL HIGH (ref 12.0–46.0)
Lymphs Abs: 2.2 10*3/uL (ref 0.7–4.0)
MCHC: 33.1 g/dL (ref 30.0–36.0)
MCV: 91.1 fl (ref 78.0–100.0)
MONO ABS: 0.3 10*3/uL (ref 0.1–1.0)
Monocytes Relative: 6.2 % (ref 3.0–12.0)
Neutro Abs: 1.6 10*3/uL (ref 1.4–7.7)
Neutrophils Relative %: 38.6 % — ABNORMAL LOW (ref 43.0–77.0)
Platelets: 232 10*3/uL (ref 150.0–400.0)
RBC: 4.02 Mil/uL (ref 3.87–5.11)
RDW: 14.4 % (ref 11.5–15.5)
WBC: 4.2 10*3/uL (ref 4.0–10.5)

## 2016-11-20 LAB — LIPID PANEL
CHOLESTEROL: 138 mg/dL (ref 0–200)
HDL: 38.7 mg/dL — ABNORMAL LOW (ref >39.00)
LDL CALC: 90 mg/dL (ref 0–99)
NONHDL: 98.95
Total CHOL/HDL Ratio: 4
Triglycerides: 47 mg/dL (ref 0.0–149.0)
VLDL: 9.4 mg/dL (ref 0.0–40.0)

## 2016-11-20 LAB — COMPREHENSIVE METABOLIC PANEL
ALT: 14 U/L (ref 0–35)
AST: 20 U/L (ref 0–37)
Albumin: 4.1 g/dL (ref 3.5–5.2)
Alkaline Phosphatase: 47 U/L (ref 39–117)
BUN: 12 mg/dL (ref 6–23)
CHLORIDE: 106 meq/L (ref 96–112)
CO2: 28 meq/L (ref 19–32)
CREATININE: 0.73 mg/dL (ref 0.40–1.20)
Calcium: 9.1 mg/dL (ref 8.4–10.5)
GFR: 114.38 mL/min (ref >60.00)
GLUCOSE: 85 mg/dL (ref 70–99)
Potassium: 3.9 mEq/L (ref 3.5–5.1)
Sodium: 138 mEq/L (ref 135–145)
Total Bilirubin: 0.4 mg/dL (ref 0.2–1.2)
Total Protein: 7.4 g/dL (ref 6.0–8.3)

## 2016-11-20 LAB — TSH: TSH: 0.69 u[IU]/mL (ref 0.35–4.50)

## 2017-03-31 ENCOUNTER — Telehealth (INDEPENDENT_AMBULATORY_CARE_PROVIDER_SITE_OTHER): Payer: Self-pay

## 2017-03-31 NOTE — Telephone Encounter (Signed)
Received a fax asking for the completed attached 25r form to be completed and faxed back to her. This pts info was on the cover but There was no attached 25r. There was an attached completed work comp med status questionaire form with a different pts info on the top. Faxed back advising this was for incorrect pt.

## 2017-09-12 ENCOUNTER — Encounter: Payer: Self-pay | Admitting: Internal Medicine

## 2017-11-21 ENCOUNTER — Encounter: Payer: Self-pay | Admitting: Internal Medicine

## 2017-11-21 ENCOUNTER — Ambulatory Visit (INDEPENDENT_AMBULATORY_CARE_PROVIDER_SITE_OTHER): Payer: Managed Care, Other (non HMO) | Admitting: Internal Medicine

## 2017-11-21 VITALS — BP 118/70 | HR 67 | Temp 98.0°F | Resp 16 | Ht 67.0 in | Wt 209.2 lb

## 2017-11-21 DIAGNOSIS — Z0000 Encounter for general adult medical examination without abnormal findings: Secondary | ICD-10-CM | POA: Diagnosis not present

## 2017-11-21 LAB — CBC WITH DIFFERENTIAL/PLATELET
BASOS ABS: 0 10*3/uL (ref 0.0–0.1)
Basophils Relative: 0.7 % (ref 0.0–3.0)
Eosinophils Absolute: 0.5 10*3/uL (ref 0.0–0.7)
Eosinophils Relative: 11.5 % — ABNORMAL HIGH (ref 0.0–5.0)
HCT: 40 % (ref 36.0–46.0)
Hemoglobin: 13.3 g/dL (ref 12.0–15.0)
LYMPHS ABS: 1.9 10*3/uL (ref 0.7–4.0)
LYMPHS PCT: 40.4 % (ref 12.0–46.0)
MCHC: 33.2 g/dL (ref 30.0–36.0)
MCV: 90.6 fl (ref 78.0–100.0)
Monocytes Absolute: 0.4 10*3/uL (ref 0.1–1.0)
Monocytes Relative: 8.5 % (ref 3.0–12.0)
NEUTROS ABS: 1.8 10*3/uL (ref 1.4–7.7)
NEUTROS PCT: 38.9 % — AB (ref 43.0–77.0)
PLATELETS: 206 10*3/uL (ref 150.0–400.0)
RBC: 4.42 Mil/uL (ref 3.87–5.11)
RDW: 14.1 % (ref 11.5–15.5)
WBC: 4.8 10*3/uL (ref 4.0–10.5)

## 2017-11-21 LAB — COMPREHENSIVE METABOLIC PANEL
ALT: 14 U/L (ref 0–35)
AST: 17 U/L (ref 0–37)
Albumin: 4.4 g/dL (ref 3.5–5.2)
Alkaline Phosphatase: 49 U/L (ref 39–117)
BILIRUBIN TOTAL: 0.4 mg/dL (ref 0.2–1.2)
BUN: 14 mg/dL (ref 6–23)
CALCIUM: 9.6 mg/dL (ref 8.4–10.5)
CO2: 27 meq/L (ref 19–32)
Chloride: 105 mEq/L (ref 96–112)
Creatinine, Ser: 0.73 mg/dL (ref 0.40–1.20)
GFR: 113.79 mL/min (ref >60.00)
GLUCOSE: 90 mg/dL (ref 70–99)
Potassium: 4.5 mEq/L (ref 3.5–5.1)
Sodium: 139 mEq/L (ref 135–145)
TOTAL PROTEIN: 7.7 g/dL (ref 6.0–8.3)

## 2017-11-21 LAB — LIPID PANEL
CHOLESTEROL: 164 mg/dL (ref 0–200)
HDL: 43.2 mg/dL (ref >39.00)
LDL Cholesterol: 111 mg/dL — ABNORMAL HIGH (ref 0–99)
NonHDL: 120.9
TRIGLYCERIDES: 48 mg/dL (ref 0.0–149.0)
Total CHOL/HDL Ratio: 4
VLDL: 9.6 mg/dL (ref 0.0–40.0)

## 2017-11-21 LAB — TSH: TSH: 0.73 u[IU]/mL (ref 0.35–4.50)

## 2017-11-21 LAB — VITAMIN D 25 HYDROXY (VIT D DEFICIENCY, FRACTURES): VITD: 38.49 ng/mL (ref 30.00–100.00)

## 2017-11-21 LAB — HEMOGLOBIN A1C: HEMOGLOBIN A1C: 5.1 % (ref 4.6–6.5)

## 2017-11-21 NOTE — Patient Instructions (Signed)
GO TO THE LAB : Get the blood work     GO TO THE FRONT DESK Schedule your next appointment for a  Physical exam in 1 year 

## 2017-11-21 NOTE — Assessment & Plan Note (Addendum)
-  Td 08-2010; declined flu shot again , benefits discuss  -Female care : saw  Dr Gaetano Net 2 weeks ago per pt  -CCS- not indicated  Labs: CMP, CBC, A1c, TSH, vitamin D, FLP -Diet and exercise discussed.  She is doing actually well, has lost some weight.  Praised.

## 2017-11-21 NOTE — Progress Notes (Signed)
Pre visit review using our clinic review tool, if applicable. No additional management support is needed unless otherwise documented below in the visit note. 

## 2017-11-21 NOTE — Progress Notes (Signed)
Subjective:    Patient ID: [name] [name], female    DOB: October 03, 1980, 37 y.o.   MRN: [id number]  DOS:  11/21/2017 Type of visit - description : CPX Interval history: Since the last office visit she is doing well.  Wt Readings from Last 3 Encounters:  11/21/17 209 lb 4 oz (94.9 kg)  11/18/16 214 lb 2 oz (97.1 kg)  06/19/16 200 lb (90.7 kg)    Review of Systems Has changed her diet to some extent, has lost some weight. Had a episode of back pain, she thinks it could have been related to her gallbladder stones, symptoms quickly resolved.  Did not have any abdominal pain, nausea, vomiting.  Other than above, a 14 point review of systems is negative    Past Medical History:  Diagnosis Date  . AMA (advanced maternal age) multigravida 68+   . Cholelithiases 11/2013   elevated LFTs  . Gestational diabetes   . Multiple allergies    chronic sneezing  . Sickle cell trait ([place])   . Uterine fibroid   . Vaginal Pap smear, abnormal     Past Surgical History:  Procedure Laterality Date  . CESAREAN SECTION WITH BILATERAL TUBAL LIGATION Bilateral 05/29/2013   Procedure: CESAREAN SECTION WITH BILATERAL TUBAL LIGATION;  Surgeon: Luz Lex, MD;  Location: [facility];  Service: Obstetrics;  Laterality: Bilateral;  PRIMARY EDC 4/30  . leg discrepancy    . LEG SURGERY     --R--from absecss with reconstructive flap- 2004    Social History   Socioeconomic History  . Marital status: Married    Spouse name: Not on file  . Number of children: 4  . Years of education: Not on file  . Highest education level: Not on file  Occupational History  . Occupation: Designer, jewellery  Social Needs  . Financial resource strain: Not on file  . Food insecurity:    Worry: Not on file    Inability: Not on file  . Transportation needs:    Medical: Not on file    Non-medical: Not on file  Tobacco Use  . Smoking status: Never Smoker  . Smokeless tobacco: Never Used  Substance and Sexual Activity    . Alcohol use: No  . Drug use: No  . Sexual activity: Not on file  Lifestyle  . Physical activity:    Days per week: Not on file    Minutes per session: Not on file  . Stress: Not on file  Relationships  . Social connections:    Talks on phone: Not on file    Gets together: Not on file    Attends religious service: Not on file    Active member of club or organization: Not on file    Attends meetings of clubs or organizations: Not on file    Relationship status: Not on file  . Intimate partner violence:    Fear of current or ex partner: Not on file    Emotionally abused: Not on file    Physically abused: Not on file    Forced sexual activity: Not on file  Other Topics Concern  . Not on file  Social History Narrative   Original from [place]    Household- pt, husband , 4 children at home      Family History  Problem Relation Age of Onset  . Diabetes Maternal Grandmother   . Coronary artery disease Neg Hx   . Colon cancer Neg Hx   . Breast cancer  Neg Hx   . Stroke Neg Hx      Allergies as of 11/21/2017      Reactions   Cephalexin    REACTION: Rash   Fluviral [flu Virus Vaccine] Other (See Comments)   unknown    Ivp Dye [iodinated Diagnostic Agents]       Medication List    as of 11/21/2017 11:59 PM   You have not been prescribed any medications.        Objective:   Physical Exam BP 118/70 (BP Location: Left Arm, Patient Position: Sitting, Cuff Size: Small)   Pulse 67   Temp 98 F (36.7 C) (Oral)   Resp 16   Ht 5\' 7"  (1.702 m)   Wt 209 lb 4 oz (94.9 kg)   LMP 11/05/2017 (Exact Date)   SpO2 98%   BMI 32.77 kg/m  General: Well developed, NAD, see BMI.  Neck: No  thyromegaly  HEENT:  Normocephalic . Face symmetric, atraumatic Lungs:  CTA B Normal respiratory effort, no intercostal retractions, no accessory muscle use. Heart: RRR,  no murmur.  No pretibial edema bilaterally  Abdomen:  Not distended, soft, non-tender. No rebound or rigidity.    Skin: Exposed areas without rash. Not pale. Not jaundice Neurologic:  alert & oriented X3.  Speech normal, gait appropriate for age and unassisted Strength symmetric and appropriate for age.  Psych: Cognition and judgment appear intact.  Cooperative with normal attention span and concentration.  Behavior appropriate. No anxious or depressed appearing.     Assessment & Plan:    Assessment H/o Gestational diabetes Allergies Sickle cell trait 2015: abd pain --> elevated LFTs --> US GB stones --> MRCP nl biliar tree, + GB stones , saw GI 11-2013, surgical referral failed ; saw GI 2017, then surgery 11-2015, HIDA (-), no surgery as off 11-2016   PLAN: Here for CPX History of cholelithiasis: Had a episode of back pain that quickly resolve, she thinks related to gallbladder stones.   She again  states that she would not like to have surgery for cholelithiasis.  Recommend to call if she has severe or persistent symptoms RTC 1 year

## 2017-11-22 NOTE — Assessment & Plan Note (Signed)
Here for CPX History of cholelithiasis: Had a episode of back pain that quickly resolve, she thinks related to gallbladder stones.   She again  states that she would not like to have surgery for cholelithiasis.  Recommend to call if she has severe or persistent symptoms RTC 1 year

## 2018-01-02 ENCOUNTER — Ambulatory Visit: Payer: Self-pay

## 2018-01-02 NOTE — Telephone Encounter (Signed)
Please advise 

## 2018-01-02 NOTE — Telephone Encounter (Addendum)
Patient called and was asking about her abnormal lab values from the labs on 11/21/17. She says on the letter, there was one that had (H) beside it and she wanted to know what that meant. I explained that Eosinophils are a component of the WBC, the infection fighting cell, and it could indicate an infection, according to literature. I explained that the [facility] is normal. She asked what can she do to bring that number to a normal range and what exactly does it mean, because she went on the internet it was giving different numbers and she didn't understand about them and would like an explanation. I advised I will send her request to Dr. Larose Kells and someone will call with his explanation, she verbalized understanding.  Reason for Disposition . [1] Follow-up call from patient regarding patient's clinical status AND [2] information NON-URGENT  Protocols used: PCP CALL - NO TRIAGE-A-AH

## 2018-01-02 NOTE — Telephone Encounter (Signed)
The CBC is essentially normal, the neutrophils are minimally low and the eosinophiles are slightly elevated. Eosinophiles are usually related to allergies.  I am not too concerned, I simply recommend to check a CBC next year.  If she remains concerned please  repeat a CBC follow-up by a office visit to discuss.

## 2018-01-02 NOTE — Telephone Encounter (Signed)
Spoke w/ Pt- informed of recommendations. Pt verbalized understanding.  

## 2018-01-02 NOTE — Telephone Encounter (Deleted)
  Reason for Disposition . [1] Follow-up call from patient regarding patient's clinical status AND [2] information NON-URGENT  Protocols used: PCP CALL - NO TRIAGE-A-AH

## 2018-07-22 ENCOUNTER — Ambulatory Visit (INDEPENDENT_AMBULATORY_CARE_PROVIDER_SITE_OTHER): Payer: BC Managed Care – PPO | Admitting: Internal Medicine

## 2018-07-22 ENCOUNTER — Encounter: Payer: Self-pay | Admitting: Internal Medicine

## 2018-07-22 DIAGNOSIS — Z0000 Encounter for general adult medical examination without abnormal findings: Secondary | ICD-10-CM

## 2018-07-22 NOTE — Assessment & Plan Note (Signed)
Here for CPX History of cholelithiasis: Denies symptoms today COVID-19: She is doing well, I encouraged her to continue using her mask even if others are not.  She is also encouraged to exercise more.  If she decides to exercise outdoors is okay not to wear a mask as long as she is distant from other people RTC 1 year

## 2018-07-22 NOTE — Assessment & Plan Note (Signed)
-  Td 08-2010;  flu shot again recommended  -Female care: to see Dr Gaetano Net next week  -CCS- not indicated  --Previous labs reviewed with the patient, she likes to check all labs possible.  Will do CMP, FLP, CBC.  Advised patient that recently, her vitamin D-TSH-A1c were normal thus no need to repeat them. -Diet and exercise discussed.

## 2018-07-22 NOTE — Progress Notes (Signed)
Subjective:    Patient ID: TANITA PALINKAS, female    DOB: [birth year], 38 y.o.   MRN: [id number]  DOS:  07/22/2018 Type of visit - description: Virtual Visit via Video Note  I connected with@ on 07/22/18 at  1:40 PM EDT by a video enabled telemedicine application and verified that I am speaking with the correct person using two identifiers.   THIS ENCOUNTER IS A VIRTUAL VISIT DUE TO COVID-19 - PATIENT WAS NOT SEEN IN THE OFFICE. PATIENT HAS CONSENTED TO VIRTUAL VISIT / TELEMEDICINE VISIT   Location of patient: home  Location of provider: office  I discussed the limitations of evaluation and management by telemedicine and the availability of in person appointments. The patient expressed understanding and agreed to proceed.  History of Present Illness: Complete physical exam In general feeling well, no major concerns except that because the quarantine she has not been able to exercise as much and has gained weight.    Wt Readings from Last 3 Encounters:  11/21/17 209 lb 4 oz (94.9 kg)  11/18/16 214 lb 2 oz (97.1 kg)  06/19/16 200 lb (90.7 kg)    Review of Systems She specifically denies fever chills. No chest pain no difficulty breathing No anxiety or depression.  Past Medical History:  Diagnosis Date  . AMA (advanced maternal age) multigravida 29+   . Cholelithiases 11/2013   elevated LFTs  . Gestational diabetes   . Multiple allergies    chronic sneezing  . Sickle cell trait ([place])   . Uterine fibroid   . Vaginal Pap smear, abnormal     Past Surgical History:  Procedure Laterality Date  . CESAREAN SECTION WITH BILATERAL TUBAL LIGATION Bilateral 05/29/2013   Procedure: CESAREAN SECTION WITH BILATERAL TUBAL LIGATION;  Surgeon: Luz Lex, MD;  Location: [facility];  Service: Obstetrics;  Laterality: Bilateral;  PRIMARY EDC 4/30  . leg discrepancy    . LEG SURGERY     --R--from absecss with reconstructive flap- 2004    Social History   Socioeconomic History  .  Marital status: Married    Spouse name: Not on file  . Number of children: 4  . Years of education: Not on file  . Highest education level: Not on file  Occupational History  . Occupation: Designer, jewellery  Social Needs  . Financial resource strain: Not on file  . Food insecurity:    Worry: Not on file    Inability: Not on file  . Transportation needs:    Medical: Not on file    Non-medical: Not on file  Tobacco Use  . Smoking status: Never Smoker  . Smokeless tobacco: Never Used  Substance and Sexual Activity  . Alcohol use: No  . Drug use: No  . Sexual activity: Not on file  Lifestyle  . Physical activity:    Days per week: Not on file    Minutes per session: Not on file  . Stress: Not on file  Relationships  . Social connections:    Talks on phone: Not on file    Gets together: Not on file    Attends religious service: Not on file    Active member of club or organization: Not on file    Attends meetings of clubs or organizations: Not on file    Relationship status: Not on file  . Intimate partner violence:    Fear of current or ex partner: Not on file    Emotionally abused: Not on file  Physically abused: Not on file    Forced sexual activity: Not on file  Other Topics Concern  . Not on file  Social History Narrative   Original from [place]    Household- pt, husband , 4 children at home    Family History  Problem Relation Age of Onset  . Diabetes Maternal Grandmother   . Coronary artery disease Neg Hx   . Colon cancer Neg Hx   . Breast cancer Neg Hx   . Stroke Neg Hx       Allergies as of 07/22/2018      Reactions   Cephalexin    REACTION: Rash   Fluviral [flu Virus Vaccine] Other (See Comments)   unknown    Ivp Dye [iodinated Diagnostic Agents]       Medication List    as of July 22, 2018  5:36 PM   You have not been prescribed any medications.         Objective:   Physical Exam There were no vitals taken for this visit. This is a  virtual video visit, she is alert oriented x3, no apparent distress.  No ambulatory BPs.  Weight today 215 pounds.    Assessment      Assessment H/o Gestational diabetes Allergies Sickle cell trait 2015: abd pain --> elevated LFTs --> US GB stones --> MRCP nl biliar tree, + GB stones , saw GI 11-2013, surgical referral failed ; saw GI 2017, then surgery 11-2015, HIDA (-), no surgery as off 11-2016   PLAN: Here for CPX History of cholelithiasis: Denies symptoms today COVID-19: She is doing well, I encouraged her to continue using her mask even if others are not.  She is also encouraged to exercise more.  If she decides to exercise outdoors is okay not to wear a mask as long as she is distant from other people RTC 1 year

## 2018-07-30 LAB — HM MAMMOGRAPHY

## 2018-07-31 ENCOUNTER — Other Ambulatory Visit: Payer: Managed Care, Other (non HMO)

## 2018-07-31 DIAGNOSIS — Z01419 Encounter for gynecological examination (general) (routine) without abnormal findings: Secondary | ICD-10-CM | POA: Diagnosis not present

## 2018-07-31 DIAGNOSIS — Z6833 Body mass index (BMI) 33.0-33.9, adult: Secondary | ICD-10-CM | POA: Diagnosis not present

## 2018-07-31 DIAGNOSIS — Z1231 Encounter for screening mammogram for malignant neoplasm of breast: Secondary | ICD-10-CM | POA: Diagnosis not present

## 2018-07-31 LAB — CBC AND DIFFERENTIAL: Hemoglobin: 12.1 (ref 12.0–16.0)

## 2018-08-05 ENCOUNTER — Other Ambulatory Visit (INDEPENDENT_AMBULATORY_CARE_PROVIDER_SITE_OTHER): Payer: BC Managed Care – PPO

## 2018-08-05 ENCOUNTER — Other Ambulatory Visit: Payer: Self-pay

## 2018-08-05 DIAGNOSIS — Z0000 Encounter for general adult medical examination without abnormal findings: Secondary | ICD-10-CM | POA: Diagnosis not present

## 2018-08-05 LAB — COMPREHENSIVE METABOLIC PANEL
ALT: 13 U/L (ref 0–35)
AST: 15 U/L (ref 0–37)
Albumin: 4.3 g/dL (ref 3.5–5.2)
Alkaline Phosphatase: 52 U/L (ref 39–117)
BUN: 11 mg/dL (ref 6–23)
CO2: 29 mEq/L (ref 19–32)
Calcium: 9.2 mg/dL (ref 8.4–10.5)
Chloride: 104 mEq/L (ref 96–112)
Creatinine, Ser: 0.72 mg/dL (ref 0.40–1.20)
GFR: 108.39 mL/min (ref >60.00)
Glucose, Bld: 101 mg/dL — ABNORMAL HIGH (ref 70–99)
Potassium: 3.8 mEq/L (ref 3.5–5.1)
Sodium: 138 mEq/L (ref 135–145)
Total Bilirubin: 0.4 mg/dL (ref 0.2–1.2)
Total Protein: 7.1 g/dL (ref 6.0–8.3)

## 2018-08-05 LAB — LIPID PANEL
Cholesterol: 132 mg/dL (ref 0–200)
HDL: 41.5 mg/dL (ref >39.00)
LDL Cholesterol: 81 mg/dL (ref 0–99)
NonHDL: 90.92
Total CHOL/HDL Ratio: 3
Triglycerides: 49 mg/dL (ref 0.0–149.0)
VLDL: 9.8 mg/dL (ref 0.0–40.0)

## 2018-08-05 LAB — CBC WITH DIFFERENTIAL/PLATELET
Basophils Absolute: 0 10*3/uL (ref 0.0–0.1)
Basophils Relative: 0.8 % (ref 0.0–3.0)
Eosinophils Absolute: 0.2 10*3/uL (ref 0.0–0.7)
Eosinophils Relative: 3.8 % (ref 0.0–5.0)
HCT: 37 % (ref 36.0–46.0)
Hemoglobin: 12.4 g/dL (ref 12.0–15.0)
Lymphocytes Relative: 42.8 % (ref 12.0–46.0)
Lymphs Abs: 1.8 10*3/uL (ref 0.7–4.0)
MCHC: 33.6 g/dL (ref 30.0–36.0)
MCV: 89.8 fl (ref 78.0–100.0)
Monocytes Absolute: 0.3 10*3/uL (ref 0.1–1.0)
Monocytes Relative: 7.5 % (ref 3.0–12.0)
Neutro Abs: 1.8 10*3/uL (ref 1.4–7.7)
Neutrophils Relative %: 45.1 % (ref 43.0–77.0)
Platelets: 197 10*3/uL (ref 150.0–400.0)
RBC: 4.12 Mil/uL (ref 3.87–5.11)
RDW: 14 % (ref 11.5–15.5)
WBC: 4.1 10*3/uL (ref 4.0–10.5)

## 2018-11-21 IMAGING — MR MR KNEE*R* W/O CM
6 series · 40 of 40 positions shown · non-contrast
Comparison: None.

CLINICAL DATA: Right knee pain. Medial right knee pain since
falling at work 4 months ago.

EXAM:
MRI OF THE RIGHT KNEE WITHOUT CONTRAST
TECHNIQUE: Multiplanar, multisequence MR imaging of the knee was performed. No
intravenous contrast was administered.

[Series 3: PD · axial · 4.0mm · 0.47mm/px · z∈[-41,+69]mm · 8 of 24 slices shown (1 of 2)]
[im 1/24]
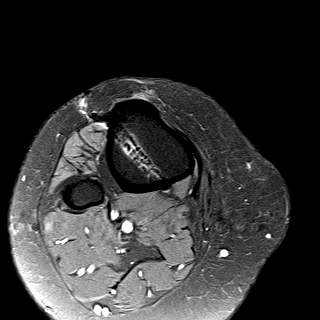
[im 4/24]
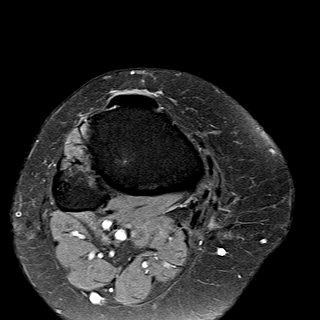
[im 7/24]
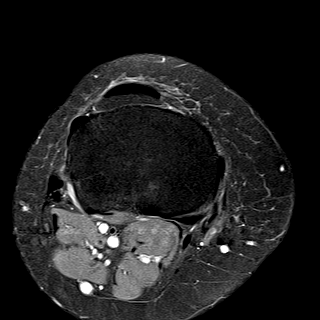
[im 10/24]
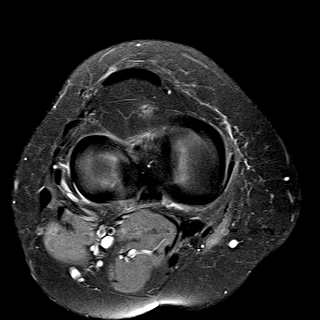
[im 14/24]
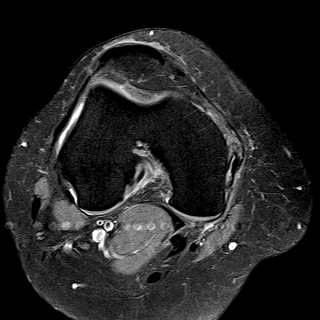
[im 17/24]
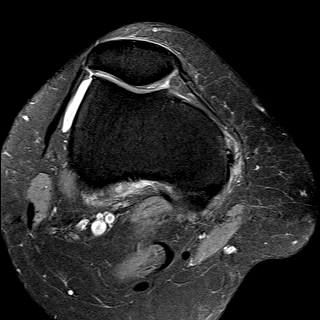
[im 20/24]
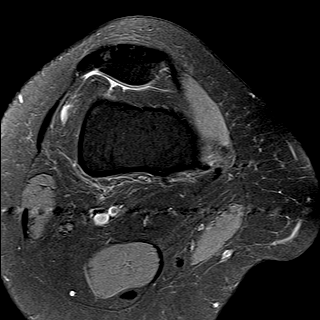
[im 24/24]
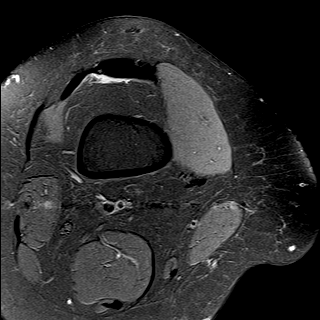

[Series 4: PD · coronal · 4.0mm · 0.50mm/px · 7 of 20 slices shown (2 of 2)]
[im 1/20]
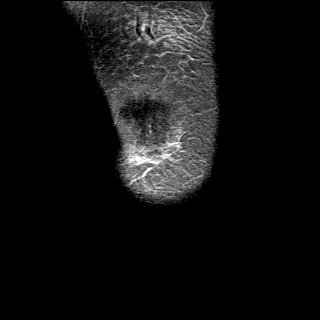
[im 4/20]
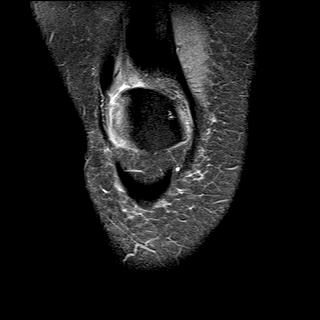
[im 7/20]
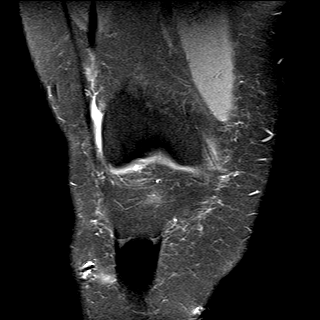
[im 10/20]
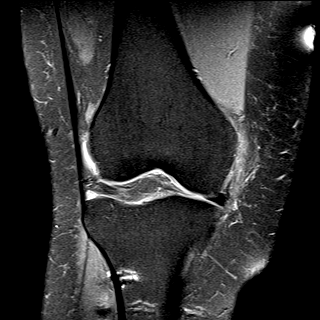
[im 13/20]
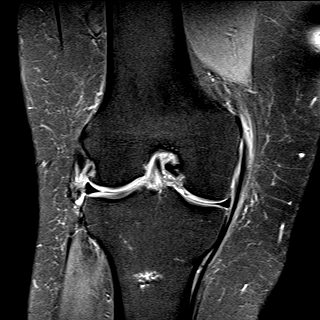
[im 16/20]
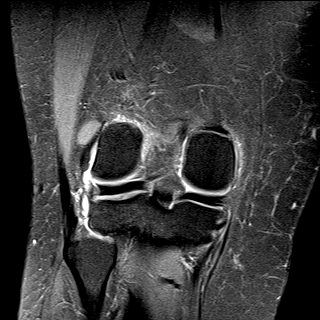
[im 20/20]
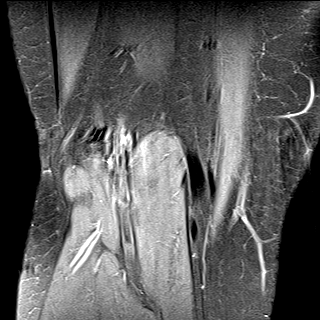

[Series 5: PD fat-sat · sagittal · 4.0mm · 0.50mm/px · 7 of 20 slices shown]
[im 1/20]
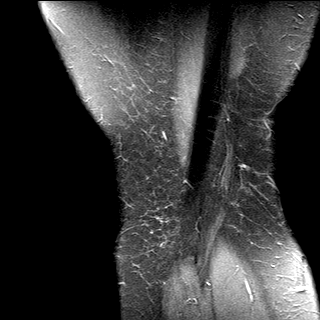
[im 4/20]
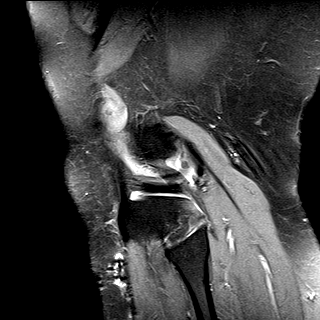
[im 7/20]
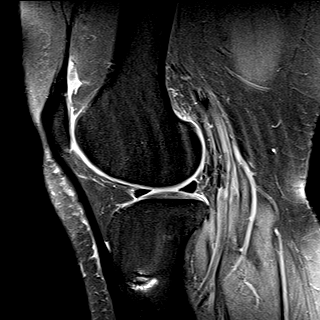
[im 10/20]
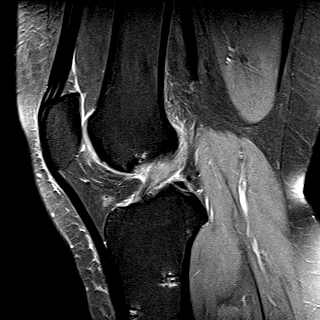
[im 13/20]
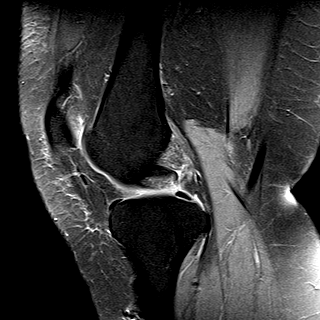
[im 16/20]
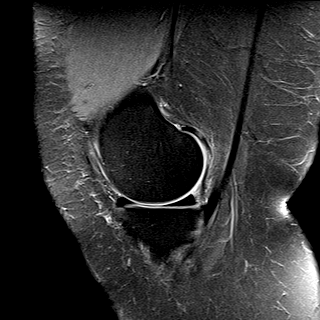
[im 20/20]
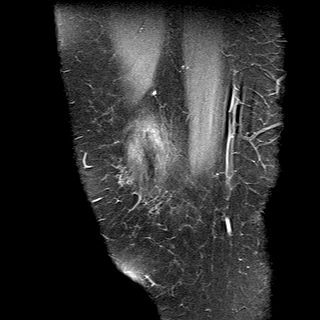

[Series 6: T1 · coronal · 4.0mm · 0.50mm/px · 7 of 20 slices shown]
[im 1/20]
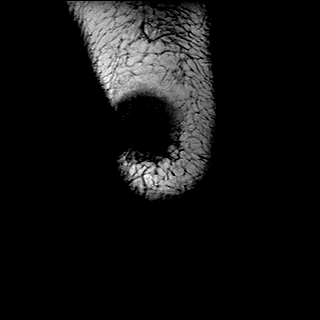
[im 4/20]
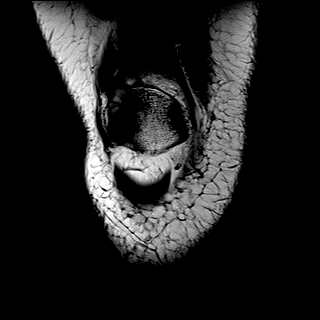
[im 7/20]
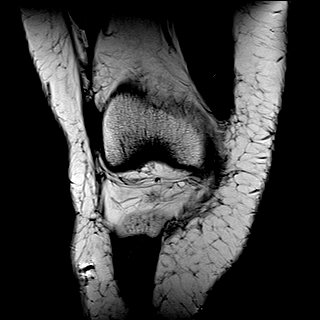
[im 10/20]
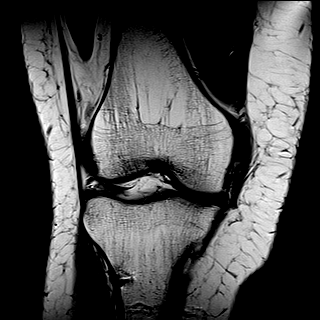
[im 13/20]
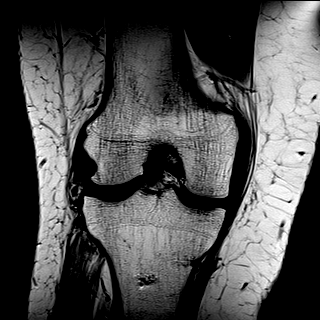
[im 16/20]
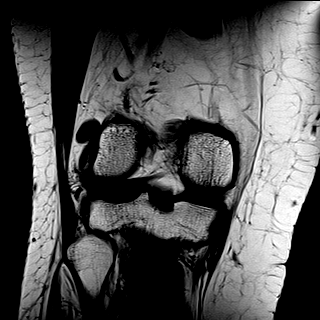
[im 20/20]
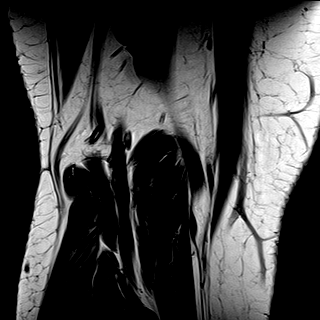

[Series 7: (id) fs · coronal · 4.0mm · 0.50mm/px · 7 of 20 slices shown]
[im 1/20]
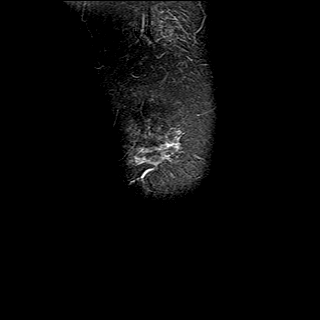
[im 4/20]
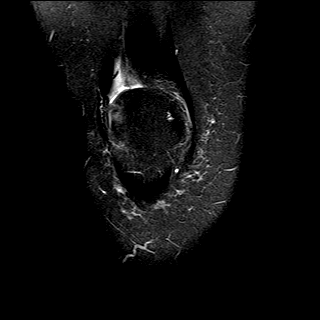
[im 7/20]
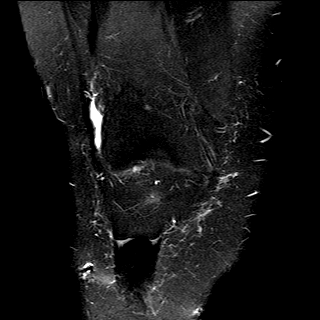
[im 10/20]
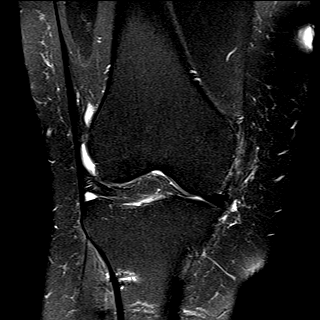
[im 13/20]
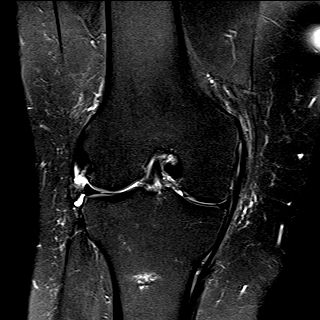
[im 16/20]
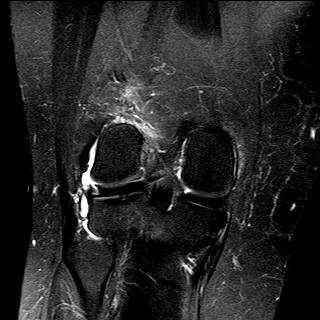
[im 20/20]
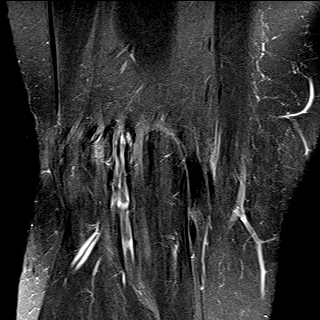

[Series 8: cor acl · oblique · 2.5mm · 0.59mm/px · 4 of 12 slices shown]
[im 1/12]
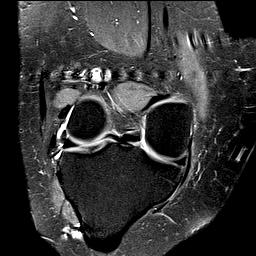
[im 4/12]
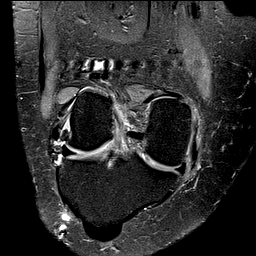
[im 8/12]
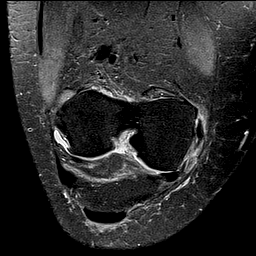
[im 12/12]
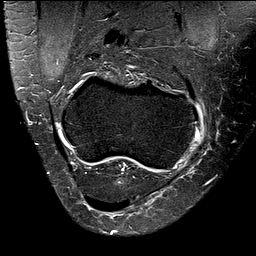

[40 of 40 positions shown; findings below may reference images not displayed]

FINDINGS: MENISCI

Medial meniscus:  Intact.

Lateral meniscus:  Intact.

LIGAMENTS

Cruciates:  Intact ACL and PCL.

Collaterals: Intact lateral collateral ligament complex. Mild
thickening of the anterior proximal MCL with surrounding edema most
concerning for mild grade 2 MCL injury without disruption.

CARTILAGE

Patellofemoral: Mild partial-thickness cartilage loss of the
patellofemoral compartment.

Medial:  Mild chondral thinning.

Lateral: Mild partial-thickness cartilage loss along the medial
aspect of the lateral femoral condyles.

Joint: No significant joint effusion. Normal Hoffa's fat. No plical
thickening.

Popliteal Fossa:  No Baker cyst.  Intact popliteus tendon.

Extensor Mechanism: Intact quadriceps tendon and patellar tendon.
Intact medial and lateral patellar retinaculum. Intact MPFL.

Bones: No acute osseous abnormality. No aggressive osseous lesion.
Postsurgical changes in the proximal tibia.

Other: No fluid collection or hematoma.
IMPRESSION: 1. Mild thickening of the anterior proximal MCL with surrounding
edema most concerning for mild grade 2 MCL injury without
disruption.
2. Mild tricompartmental cartilage abnormalities as described above.

## 2018-11-23 ENCOUNTER — Encounter: Payer: Self-pay | Admitting: Internal Medicine

## 2018-12-04 DIAGNOSIS — N760 Acute vaginitis: Secondary | ICD-10-CM | POA: Diagnosis not present

## 2019-06-22 ENCOUNTER — Telehealth: Payer: Self-pay | Admitting: Internal Medicine

## 2019-06-22 DIAGNOSIS — R7989 Other specified abnormal findings of blood chemistry: Secondary | ICD-10-CM

## 2019-06-22 DIAGNOSIS — K8020 Calculus of gallbladder without cholecystitis without obstruction: Secondary | ICD-10-CM

## 2019-06-22 NOTE — Telephone Encounter (Signed)
CallerJakiera [name]  Call Back # [phone]  Patient is requesting a referral to gastrologist . Patient states old referral is outdated per office.   Patient would like to see : Dr. Mayme Genta, MD : [facility]  [address], [place], [place] [address] Phone: [phone]

## 2019-06-22 NOTE — Telephone Encounter (Signed)
Previous GI referral to Dr. Ardis Hughs for elevated LFTs and symptomatic cholelithiasis. Pt now requesting to see Dr. Earlean Shawl. Referral placed.

## 2019-07-26 ENCOUNTER — Ambulatory Visit (INDEPENDENT_AMBULATORY_CARE_PROVIDER_SITE_OTHER): Payer: BC Managed Care – PPO | Admitting: Internal Medicine

## 2019-07-26 ENCOUNTER — Encounter: Payer: Self-pay | Admitting: Internal Medicine

## 2019-07-26 ENCOUNTER — Other Ambulatory Visit: Payer: Self-pay

## 2019-07-26 VITALS — BP 134/82 | HR 74 | Temp 96.9°F | Resp 16 | Ht 67.0 in | Wt 219.0 lb

## 2019-07-26 DIAGNOSIS — Z09 Encounter for follow-up examination after completed treatment for conditions other than malignant neoplasm: Secondary | ICD-10-CM

## 2019-07-26 DIAGNOSIS — Z0000 Encounter for general adult medical examination without abnormal findings: Secondary | ICD-10-CM | POA: Diagnosis not present

## 2019-07-26 LAB — TSH: TSH: 1.29 u[IU]/mL (ref 0.35–4.50)

## 2019-07-26 LAB — CBC WITH DIFFERENTIAL/PLATELET
Basophils Absolute: 0 10*3/uL (ref 0.0–0.1)
Basophils Relative: 0.9 % (ref 0.0–3.0)
Eosinophils Absolute: 0.2 10*3/uL (ref 0.0–0.7)
Eosinophils Relative: 3.9 % (ref 0.0–5.0)
HCT: 37.6 % (ref 36.0–46.0)
Hemoglobin: 12.6 g/dL (ref 12.0–15.0)
Lymphocytes Relative: 46.3 % — ABNORMAL HIGH (ref 12.0–46.0)
Lymphs Abs: 2.1 10*3/uL (ref 0.7–4.0)
MCHC: 33.5 g/dL (ref 30.0–36.0)
MCV: 90.3 fl (ref 78.0–100.0)
Monocytes Absolute: 0.3 10*3/uL (ref 0.1–1.0)
Monocytes Relative: 7.3 % (ref 3.0–12.0)
Neutro Abs: 1.8 10*3/uL (ref 1.4–7.7)
Neutrophils Relative %: 41.6 % — ABNORMAL LOW (ref 43.0–77.0)
Platelets: 208 10*3/uL (ref 150.0–400.0)
RBC: 4.16 Mil/uL (ref 3.87–5.11)
RDW: 14.6 % (ref 11.5–15.5)
WBC: 4.4 10*3/uL (ref 4.0–10.5)

## 2019-07-26 LAB — LIPID PANEL
Cholesterol: 162 mg/dL (ref 0–200)
HDL: 43.8 mg/dL (ref >39.00)
LDL Cholesterol: 103 mg/dL — ABNORMAL HIGH (ref 0–99)
NonHDL: 118.27
Total CHOL/HDL Ratio: 4
Triglycerides: 75 mg/dL (ref 0.0–149.0)
VLDL: 15 mg/dL (ref 0.0–40.0)

## 2019-07-26 LAB — COMPREHENSIVE METABOLIC PANEL
ALT: 23 U/L (ref 0–35)
AST: 19 U/L (ref 0–37)
Albumin: 4.1 g/dL (ref 3.5–5.2)
Alkaline Phosphatase: 60 U/L (ref 39–117)
BUN: 15 mg/dL (ref 6–23)
CO2: 29 mEq/L (ref 19–32)
Calcium: 9.1 mg/dL (ref 8.4–10.5)
Chloride: 105 mEq/L (ref 96–112)
Creatinine, Ser: 0.78 mg/dL (ref 0.40–1.20)
GFR: 98.35 mL/min (ref >60.00)
Glucose, Bld: 133 mg/dL — ABNORMAL HIGH (ref 70–99)
Potassium: 4.2 mEq/L (ref 3.5–5.1)
Sodium: 138 mEq/L (ref 135–145)
Total Bilirubin: 0.3 mg/dL (ref 0.2–1.2)
Total Protein: 7.1 g/dL (ref 6.0–8.3)

## 2019-07-26 LAB — HEMOGLOBIN A1C: Hgb A1c MFr Bld: 5.8 % (ref 4.6–6.5)

## 2019-07-26 NOTE — Progress Notes (Signed)
Subjective:    Patient ID: [name] [name], female    DOB: Feb 22, 1980, 39 y.o.   MRN: [id number]  DOS:  07/26/2019 Type of visit - description: CPX Since the last office visit, has developed   sporadic lower abdominal pain, denies fever, chills.  No nausea, vomiting or blood in the stools. No dysuria or gross hematuria. Reports some stress at work.  BP Readings from Last 3 Encounters:  07/26/19 134/82  11/21/17 118/70  11/18/16 118/68     Review of Systems  Other than above, a 14 point review of systems is negative     Past Medical History:  Diagnosis Date  . AMA (advanced maternal age) multigravida 42+   . Cholelithiases 11/2013   elevated LFTs  . Gestational diabetes   . Multiple allergies    chronic sneezing  . Sickle cell trait ([place])   . Uterine fibroid   . Vaginal Pap smear, abnormal     Past Surgical History:  Procedure Laterality Date  . CESAREAN SECTION WITH BILATERAL TUBAL LIGATION Bilateral 05/29/2013   Procedure: CESAREAN SECTION WITH BILATERAL TUBAL LIGATION;  Surgeon: Luz Lex, MD;  Location: [facility];  Service: Obstetrics;  Laterality: Bilateral;  PRIMARY EDC 4/30  . leg discrepancy    . LEG SURGERY     --R--from absecss with reconstructive flap- 2004   Family History  Problem Relation Age of Onset  . Diabetes Maternal Grandmother   . Coronary artery disease Neg Hx   . Colon cancer Neg Hx   . Breast cancer Neg Hx   . Stroke Neg Hx     Allergies as of 07/26/2019      Reactions   Cephalexin    REACTION: Rash   Fluviral [flu Virus Vaccine] Other (See Comments)   unknown    Ivp Dye [iodinated Diagnostic Agents]       Medication List    as of July 26, 2019 11:59 PM   You have not been prescribed any medications.        Objective:   Physical Exam BP 134/82 (BP Location: Right Arm, Patient Position: Sitting, Cuff Size: Normal)   Pulse 74   Temp (!) 96.9 F (36.1 C) (Temporal)   Resp 16   Ht 5\' 7"  (1.702 m)   Wt 219 lb (99.3 kg)    LMP 07/22/2019   SpO2 100%   BMI 34.30 kg/m  General: Well developed, NAD, BMI noted Neck: No  thyromegaly  HEENT:  Normocephalic . Face symmetric, atraumatic Lungs:  CTA B Normal respiratory effort, no intercostal retractions, no accessory muscle use. Heart: RRR,  no murmur.  Abdomen:  Not distended, soft, non-tender. No rebound or rigidity.   Lower extremities: no pretibial edema bilaterally  Skin: Exposed areas without rash. Not pale. Not jaundice Neurologic:  alert & oriented X3.  Speech normal, gait appropriate for age and unassisted Strength symmetric and appropriate for age.  Psych: Cognition and judgment appear intact.  Cooperative with normal attention span and concentration.  Behavior appropriate. No anxious or depressed appearing.     Assessment    Assessment H/o Gestational diabetes Allergies Sickle cell trait 2015: abd pain --> elevated LFTs --> US GB stones --> MRCP nl biliar tree, + GB stones , saw GI 11-2013, surgical referral failed ; saw GI 2017, then surgery 11-2015, HIDA (-), no surgery  BTL  PLAN: Here for CPX History of cholelithiasis: Abdominal pain resurfaced few weeks ago, clinically not consistent with cholelithiasis nevertheless recently requested a  GI referral to Dr. Cher Nakai, appointment is in 2 weeks.  Currently with no red flag symptoms Stress, work-related: Reports long work hours, recommend to address with his employer, another good step will be to talk with a counselor for help with the stress management.  Information provided Blood pressure: Current BP is normal but higher than before, patient concerned, explained that at this point there is no need for meds however advised to stay active, eat low-salt and check monthly.  For more information about blood pressure, rec  to visit the [facility]. RTC 1 year    This visit occurred during the SARS-CoV-2 public health emergency.  Safety protocols were in place, including screening questions  prior to the visit, additional usage of staff PPE, and extensive cleaning of exam room while observing appropriate contact time as indicated for disinfecting solutions.

## 2019-07-26 NOTE — Progress Notes (Signed)
Pre visit review using our clinic review tool, if applicable. No additional management support is needed unless otherwise documented below in the visit note. 

## 2019-07-26 NOTE — Patient Instructions (Addendum)
Check the  blood pressure   monthly   BP GOAL is between 110/65 and  135/85. If it is consistently higher or lower, let me know   GO TO THE LAB : Get the blood work    The [employer] site has a lot of good information about blood pressure :  [employer]    GO TO THE FRONT DESK, PLEASE SCHEDULE YOUR APPOINTMENTS Come back for   A physical in 1 year

## 2019-07-27 NOTE — Assessment & Plan Note (Signed)
Here for CPX History of cholelithiasis: Abdominal pain resurfaced few weeks ago, clinically not consistent with cholelithiasis nevertheless recently requested a GI referral to Dr. Cher Nakai, appointment is in 2 weeks.  Currently with no red flag symptoms Stress, work-related: Reports long work hours, recommend to address with his employer, another good step will be to talk with a counselor for help with the stress management.  Information provided Blood pressure: Current BP is normal but higher than before, patient concerned, explained that at this point there is no need for meds however advised to stay active, eat low-salt and check monthly.  For more information about blood pressure, rec  to visit the [facility]. RTC 1 year

## 2019-07-27 NOTE — Assessment & Plan Note (Signed)
-  Td 08-2010 -Had a Covid shot -Female care: Per gynecology, Pap smear: 10/18/2016, mammogram 07/30/2018  -CCS- not indicated  but will see GI soon related to abdominal pain -Diet and exercise discussed.  -Labs: CMP, FLP, TSH, A1c, CBC.  Request "all blood work done", offered to HIV RPR but she declined it.

## 2019-08-02 DIAGNOSIS — R635 Abnormal weight gain: Secondary | ICD-10-CM | POA: Diagnosis not present

## 2019-08-02 DIAGNOSIS — Z1329 Encounter for screening for other suspected endocrine disorder: Secondary | ICD-10-CM | POA: Diagnosis not present

## 2019-08-02 DIAGNOSIS — Z01419 Encounter for gynecological examination (general) (routine) without abnormal findings: Secondary | ICD-10-CM | POA: Diagnosis not present

## 2019-08-02 DIAGNOSIS — Z6834 Body mass index (BMI) 34.0-34.9, adult: Secondary | ICD-10-CM | POA: Diagnosis not present

## 2019-08-02 LAB — HM PAP SMEAR: HM Pap smear: NEGATIVE

## 2019-08-02 LAB — RESULTS CONSOLE HPV: CHL HPV: NEGATIVE

## 2019-10-19 LAB — HM MAMMOGRAPHY

## 2020-04-18 ENCOUNTER — Encounter: Payer: BC Managed Care – PPO | Admitting: Internal Medicine

## 2020-04-19 ENCOUNTER — Encounter: Payer: Self-pay | Admitting: Internal Medicine

## 2020-04-19 ENCOUNTER — Other Ambulatory Visit: Payer: Self-pay

## 2020-04-19 ENCOUNTER — Ambulatory Visit (INDEPENDENT_AMBULATORY_CARE_PROVIDER_SITE_OTHER): Payer: BC Managed Care – PPO | Admitting: Internal Medicine

## 2020-04-19 ENCOUNTER — Encounter: Payer: BC Managed Care – PPO | Admitting: Internal Medicine

## 2020-04-19 VITALS — BP 111/77 | HR 65 | Temp 97.6°F | Ht 67.0 in | Wt 214.0 lb

## 2020-04-19 DIAGNOSIS — Z1159 Encounter for screening for other viral diseases: Secondary | ICD-10-CM | POA: Diagnosis not present

## 2020-04-19 DIAGNOSIS — Z0000 Encounter for general adult medical examination without abnormal findings: Secondary | ICD-10-CM

## 2020-04-19 DIAGNOSIS — R739 Hyperglycemia, unspecified: Secondary | ICD-10-CM

## 2020-04-19 NOTE — Patient Instructions (Addendum)
Happy [birth year] Birthday!     GO TO THE LAB : Get the blood work     [place], [place] Come back for   a physical exam in 1 year

## 2020-04-19 NOTE — Progress Notes (Signed)
   Subjective:    Patient ID: [name] [name], female    DOB: 22-Sep-1980, 40 y.o.   MRN: [id number]  DOS:  04/19/2020 Type of visit - description: CPX  Reports no concerns. Specifically has no nausea, vomiting or abdominal pain.   Review of Systems   A 14 point review of systems is negative     Past Medical History:  Diagnosis Date  . AMA (advanced maternal age) multigravida 82+   . Cholelithiases 11/2013   elevated LFTs  . Gestational diabetes   . Multiple allergies    chronic sneezing  . Sickle cell trait ([place])   . Uterine fibroid   . Vaginal Pap smear, abnormal     Past Surgical History:  Procedure Laterality Date  . CESAREAN SECTION WITH BILATERAL TUBAL LIGATION Bilateral 05/29/2013   Procedure: CESAREAN SECTION WITH BILATERAL TUBAL LIGATION;  Surgeon: Luz Lex, MD;  Location: [facility];  Service: Obstetrics;  Laterality: Bilateral;  PRIMARY EDC 4/30  . leg discrepancy    . LEG SURGERY     --R--from absecss with reconstructive flap- 2004    Allergies as of 04/19/2020      Reactions   Cephalexin    REACTION: Rash   Fluviral [influenza Virus Vaccine] Other (See Comments)   unknown    Hemophilus B Polysaccharide Vaccine Other (See Comments)   unknown    Ivp Dye [iodinated Diagnostic Agents]       Medication List    as of April 19, 2020 11:59 PM   You have not been prescribed any medications.        Objective:   Physical Exam BP 111/77 (BP Location: Left Arm, Patient Position: Sitting, Cuff Size: Large)   Pulse 65   Temp 97.6 F (36.4 C) (Oral)   Ht 5\' 7"  (1.702 m)   Wt 214 lb (97.1 kg)   SpO2 100%   BMI 33.52 kg/m  General: Well developed, NAD, BMI noted Neck: No  thyromegaly  HEENT:  Normocephalic . Face symmetric, atraumatic Lungs:  CTA B Normal respiratory effort, no intercostal retractions, no accessory muscle use. Heart: RRR,  no murmur.  Abdomen:  Not distended, soft, non-tender. No rebound or rigidity.   Lower extremities: no  pretibial edema bilaterally  Skin: Exposed areas without rash. Not pale. Not jaundice Neurologic:  alert & oriented X3.  Speech normal, gait appropriate for age and unassisted Strength symmetric and appropriate for age.  Psych: Cognition and judgment appear intact.  Cooperative with normal attention span and concentration.  Behavior appropriate. No anxious or depressed appearing.     Assessment      Assessment H/o Gestational diabetes Allergies Sickle cell trait 2015: abd pain --> elevated LFTs --> US GB stones --> MRCP nl biliar tree, + GB stones , saw GI 11-2013, surgical referral failed ; saw GI 2017, then surgery 11-2015, HIDA (-), no surgery  BTL  PLAN:  Here for CPX.  She is aware it is a early CPX and her insurance may not cover it, she asked me to proceed. Abdominal pain: Saw GI On 10/06/2019, they recommended a EGD for further evaluation of abdominal pain, the patient declined, currently asymptomatic. RTC 1 year    This visit occurred during the SARS-CoV-2 public health emergency.  Safety protocols were in place, including screening questions prior to the visit, additional usage of staff PPE, and extensive cleaning of exam room while observing appropriate contact time as indicated for disinfecting solutions.

## 2020-04-20 ENCOUNTER — Encounter: Payer: Self-pay | Admitting: Internal Medicine

## 2020-04-20 LAB — CBC WITH DIFFERENTIAL/PLATELET
Basophils Absolute: 0 10*3/uL (ref 0.0–0.1)
Basophils Relative: 1.1 % (ref 0.0–3.0)
Eosinophils Absolute: 0.1 10*3/uL (ref 0.0–0.7)
Eosinophils Relative: 3.4 % (ref 0.0–5.0)
HCT: 37.2 % (ref 36.0–46.0)
Hemoglobin: 12.6 g/dL (ref 12.0–15.0)
Lymphocytes Relative: 45.5 % (ref 12.0–46.0)
Lymphs Abs: 2 10*3/uL (ref 0.7–4.0)
MCHC: 33.8 g/dL (ref 30.0–36.0)
MCV: 89.3 fl (ref 78.0–100.0)
Monocytes Absolute: 0.3 10*3/uL (ref 0.1–1.0)
Monocytes Relative: 5.9 % (ref 3.0–12.0)
Neutro Abs: 1.9 10*3/uL (ref 1.4–7.7)
Neutrophils Relative %: 44.1 % (ref 43.0–77.0)
Platelets: 236 10*3/uL (ref 150.0–400.0)
RBC: 4.16 Mil/uL (ref 3.87–5.11)
RDW: 14.9 % (ref 11.5–15.5)
WBC: 4.3 10*3/uL (ref 4.0–10.5)

## 2020-04-20 LAB — COMPREHENSIVE METABOLIC PANEL
ALT: 13 U/L (ref 0–35)
AST: 15 U/L (ref 0–37)
Albumin: 4.2 g/dL (ref 3.5–5.2)
Alkaline Phosphatase: 54 U/L (ref 39–117)
BUN: 10 mg/dL (ref 6–23)
CO2: 30 mEq/L (ref 19–32)
Calcium: 9.6 mg/dL (ref 8.4–10.5)
Chloride: 105 mEq/L (ref 96–112)
Creatinine, Ser: 0.8 mg/dL (ref 0.40–1.20)
GFR: 91.14 mL/min (ref >60.00)
Glucose, Bld: 88 mg/dL (ref 70–99)
Potassium: 4.3 mEq/L (ref 3.5–5.1)
Sodium: 140 mEq/L (ref 135–145)
Total Bilirubin: 0.4 mg/dL (ref 0.2–1.2)
Total Protein: 7.5 g/dL (ref 6.0–8.3)

## 2020-04-20 LAB — LIPID PANEL
Cholesterol: 163 mg/dL (ref 0–200)
HDL: 44.7 mg/dL (ref >39.00)
LDL Cholesterol: 107 mg/dL — ABNORMAL HIGH (ref 0–99)
NonHDL: 118.06
Total CHOL/HDL Ratio: 4
Triglycerides: 53 mg/dL (ref 0.0–149.0)
VLDL: 10.6 mg/dL (ref 0.0–40.0)

## 2020-04-20 LAB — HEPATITIS C ANTIBODY
Hepatitis C Ab: NONREACTIVE
SIGNAL TO CUT-OFF: 0.01 (ref <1.00)

## 2020-04-20 LAB — VITAMIN D 25 HYDROXY (VIT D DEFICIENCY, FRACTURES): VITD: 45.91 ng/mL (ref 30.00–100.00)

## 2020-04-20 LAB — HEMOGLOBIN A1C: Hgb A1c MFr Bld: 5.7 % (ref 4.6–6.5)

## 2020-04-20 NOTE — Assessment & Plan Note (Signed)
-  Td 08-2010, declined a booster - Covid shot x2, booster benefits d/w pt  - flu shot : declined  -Female care: Per gynecology,planning to see gyn next month,  had a MMG 2021 per pt (not on [facility])   -CCS- not indicated  -Diet and exercise discussed. -Labs: CMP, CBC, FLP, TSH, vitamin D, A1c, hep C.  Declined HIV.

## 2020-04-20 NOTE — Assessment & Plan Note (Signed)
Here for CPX.  She is aware it is a early CPX and her insurance may not cover it, she asked me to proceed. Abdominal pain: Saw GI On 10/06/2019, they recommended a EGD for further evaluation of abdominal pain, the patient declined, currently asymptomatic. RTC 1 year

## 2020-04-25 ENCOUNTER — Encounter: Payer: Self-pay | Admitting: Internal Medicine

## 2020-06-15 ENCOUNTER — Other Ambulatory Visit: Payer: Self-pay

## 2020-06-15 ENCOUNTER — Ambulatory Visit (INDEPENDENT_AMBULATORY_CARE_PROVIDER_SITE_OTHER): Payer: BC Managed Care – PPO | Admitting: Pulmonary Disease

## 2020-06-15 ENCOUNTER — Encounter: Payer: Self-pay | Admitting: Pulmonary Disease

## 2020-06-15 VITALS — BP 118/72 | HR 89 | Temp 97.3°F | Ht 67.0 in | Wt 214.6 lb

## 2020-06-15 DIAGNOSIS — R0683 Snoring: Secondary | ICD-10-CM | POA: Diagnosis not present

## 2020-06-15 NOTE — Patient Instructions (Signed)
Moderate probability of significant obstructive sleep apnea Daytime sleepiness  We will schedule you for a home sleep study  Regular exercises should be continued, weight loss efforts will help snoring  Treatment options as we discussed  I will see you back in 3 months Sleep Apnea Sleep apnea affects breathing during sleep. It causes breathing to stop for a short time or to become shallow. It can also increase the risk of:  Heart attack.  Stroke.  Being very overweight (obese).  Diabetes.  Heart failure.  Irregular heartbeat. The goal of treatment is to help you breathe normally again. What are the causes? There are three kinds of sleep apnea:  Obstructive sleep apnea. This is caused by a blocked or collapsed airway.  Central sleep apnea. This happens when the brain does not send the right signals to the muscles that control breathing.  Mixed sleep apnea. This is a combination of obstructive and central sleep apnea. The most common cause of this condition is a collapsed or blocked airway. This can happen if:  Your throat muscles are too relaxed.  Your tongue and tonsils are too large.  You are overweight.  Your airway is too small.   What increases the risk?  Being overweight.  Smoking.  Having a small airway.  Being older.  Being female.  Drinking alcohol.  Taking medicines to calm yourself (sedatives or tranquilizers).  Having family members with the condition. What are the signs or symptoms?  Trouble staying asleep.  Being sleepy or tired during the day.  Getting angry a lot.  Loud snoring.  Headaches in the morning.  Not being able to focus your mind (concentrate).  Forgetting things.  Less interest in sex.  Mood swings.  Personality changes.  Feelings of sadness (depression).  Waking up a lot during the night to pee (urinate).  Dry mouth.  Sore throat. How is this diagnosed?  Your medical history.  A physical exam.  A  test that is done when you are sleeping (sleep study). The test is most often done in a sleep lab but may also be done at home. How is this treated?  Sleeping on your side.  Using a medicine to get rid of mucus in your nose (decongestant).  Avoiding the use of alcohol, medicines to help you relax, or certain pain medicines (narcotics).  Losing weight, if needed.  Changing your diet.  Not smoking.  Using a machine to open your airway while you sleep, such as: ? An oral appliance. This is a mouthpiece that shifts your lower jaw forward. ? A CPAP device. This device blows air through a mask when you breathe out (exhale). ? An EPAP device. This has valves that you put in each nostril. ? A BPAP device. This device blows air through a mask when you breathe in (inhale) and breathe out.  Having surgery if other treatments do not work. It is important to get treatment for sleep apnea. Without treatment, it can lead to:  High blood pressure.  Coronary artery disease.  In men, not being able to have an erection (impotence).  Reduced thinking ability.   Follow these instructions at home: Lifestyle  Make changes that your doctor recommends.  Eat a healthy diet.  Lose weight if needed.  Avoid alcohol, medicines to help you relax, and some pain medicines.  Do not use any products that contain nicotine or tobacco, such as cigarettes, e-cigarettes, and chewing tobacco. If you need help quitting, ask your doctor. General instructions  Take over-the-counter and prescription medicines only as told by your doctor.  If you were given a machine to use while you sleep, use it only as told by your doctor.  If you are having surgery, make sure to tell your doctor you have sleep apnea. You may need to bring your device with you.  Keep all follow-up visits as told by your doctor. This is important. Contact a doctor if:  The machine that you were given to use during sleep bothers you or does  not seem to be working.  You do not get better.  You get worse. Get help right away if:  Your chest hurts.  You have trouble breathing in enough air.  You have an uncomfortable feeling in your back, arms, or stomach.  You have trouble talking.  One side of your body feels weak.  A part of your face is hanging down. These symptoms may be an emergency. Do not wait to see if the symptoms will go away. Get medical help right away. Call your local emergency services (911 in the [place] Do not drive yourself to the hospital. Summary  This condition affects breathing during sleep.  The most common cause is a collapsed or blocked airway.  The goal of treatment is to help you breathe normally while you sleep. This information is not intended to replace advice given to you by your health care provider. Make sure you discuss any questions you have with your health care provider. Document Revised: 11/14/2017 Document Reviewed: 09/23/2017 Elsevier Patient Education  [place].

## 2020-06-15 NOTE — Progress Notes (Signed)
[name] [name]    [id number]    10/18/1980  Primary Care Physician:Paz, Alda Berthold, MD  Referring Physician: Everlene Farrier, [address] [place],  [place] [id number]  Chief complaint:   Patient being seen for snoring, apneas  HPI:  She did play a recording of snoring to me in the office today, recorded by her daughter Has been told by spouse in the past of snoring  She is more bothered by the snoring, does not believe she has sleep apnea  Usually goes to bed between 10 and 12 Sleep onset varies Wakes up maybe about twice during the night Final wake up time about 7 AM  Weight has remained about the same  No family history of obstructive sleep apnea Denies headaches in the morning Denies dryness of the mouth in the morning she does feel sleepy in the morning She does have nonrestorative sleep  She feels tired in the mornings  Never smoker   No outpatient encounter medications on file as of 06/15/2020.   No facility-administered encounter medications on file as of 06/15/2020.    Allergies as of 06/15/2020 - Review Complete 06/15/2020  Allergen Reaction Noted  . Cephalexin  02/03/2008  . Fluviral [influenza virus vaccine] Other (See Comments) 09/07/2012  . Hemophilus b polysaccharide vaccine Other (See Comments) 09/07/2012  . Ivp dye [iodinated diagnostic agents]  11/29/2013    Past Medical History:  Diagnosis Date  . AMA (advanced maternal age) multigravida 23+   . Cholelithiases 11/2013   elevated LFTs  . Gestational diabetes   . Multiple allergies    chronic sneezing  . Sickle cell trait ([place])   . Uterine fibroid   . Vaginal Pap smear, abnormal     Past Surgical History:  Procedure Laterality Date  . CESAREAN SECTION WITH BILATERAL TUBAL LIGATION Bilateral 05/29/2013   Procedure: CESAREAN SECTION WITH BILATERAL TUBAL LIGATION;  Surgeon: Luz Lex, MD;  Location: [facility];  Service: Obstetrics;  Laterality: Bilateral;   PRIMARY EDC 4/30  . leg discrepancy    . LEG SURGERY     --R--from absecss with reconstructive flap- 2004    Family History  Problem Relation Age of Onset  . Diabetes Maternal Grandmother   . Coronary artery disease Neg Hx   . Colon cancer Neg Hx   . Breast cancer Neg Hx   . Stroke Neg Hx     Social History   Socioeconomic History  . Marital status: Married    Spouse name: Not on file  . Number of children: 4  . Years of education: Not on file  . Highest education level: Not on file  Occupational History  . Occupation: Designer, jewellery  Tobacco Use  . Smoking status: Never Smoker  . Smokeless tobacco: Never Used  Substance and Sexual Activity  . Alcohol use: No  . Drug use: No  . Sexual activity: Not on file  Other Topics Concern  . Not on file  Social History Narrative   Original from [place]    Household- pt, husband , 4 children at home    Social Determinants of Health   Financial Resource Strain: Not on [employer] Insecurity: Not on file  Transportation Needs: Not on file  Physical Activity: Not on file  Stress: Not on file  Social Connections: Not on file  Intimate Partner Violence: Not on file    Review of Systems  Constitutional: Negative for fatigue.  Respiratory: Negative for shortness of breath.   Cardiovascular: Negative for chest pain.  Psychiatric/Behavioral: Positive for sleep disturbance.    Vitals:   06/15/20 0855  BP: 118/72  Pulse: 89  Temp: (!) 97.3 F (36.3 C)  SpO2: 98%     Physical Exam Constitutional:      Appearance: She is obese.  HENT:     Head: Normocephalic.     Mouth/Throat:     Mouth: Mucous membranes are moist.     Comments: Macroglossia, crowded oropharynx Eyes:     Pupils: Pupils are equal, round, and reactive to light.  Cardiovascular:     Rate and Rhythm: Normal rate and regular rhythm.     Heart sounds: No murmur heard. No friction rub.  Pulmonary:     Effort: No respiratory distress.     Breath  sounds: No stridor. No wheezing or rhonchi.  Musculoskeletal:     Cervical back: No rigidity or tenderness.  Neurological:     Mental Status: She is alert.   No flowsheet data found. Epworth Sleepiness Scale of 13  Assessment:  Excessive daytime sleepiness  Nonrestorative sleep  History of snoring  Obesity  Moderate probability of significant obstructive sleep apnea  Pathophysiology of sleep disordered breathing reviewed with the patient Treatment options for sleep disordered breathing discussed with the patient  Plan/Recommendations:  We will schedule patient for a home sleep study  Importance of diet and exercise discussed  Weight maintenance efforts encouraged  Tentative follow-up in 3 months  Encouraged to call with any significant concerns  Sherrilyn Rist MD [facility] 06/15/2020, 8:59 AM  CC: Everlene Farrier, MD

## 2020-07-24 ENCOUNTER — Encounter: Payer: Self-pay | Admitting: Pulmonary Disease

## 2020-07-28 ENCOUNTER — Encounter: Payer: BC Managed Care – PPO | Admitting: Internal Medicine

## 2020-10-18 LAB — HM MAMMOGRAPHY

## 2020-10-26 ENCOUNTER — Encounter: Payer: Self-pay | Admitting: Internal Medicine

## 2021-03-23 DIAGNOSIS — H40013 Open angle with borderline findings, low risk, bilateral: Secondary | ICD-10-CM | POA: Diagnosis not present

## 2021-03-23 DIAGNOSIS — H04123 Dry eye syndrome of bilateral lacrimal glands: Secondary | ICD-10-CM | POA: Diagnosis not present

## 2021-04-20 ENCOUNTER — Encounter: Payer: Self-pay | Admitting: Internal Medicine

## 2021-04-20 ENCOUNTER — Ambulatory Visit (INDEPENDENT_AMBULATORY_CARE_PROVIDER_SITE_OTHER): Payer: BC Managed Care – PPO | Admitting: Internal Medicine

## 2021-04-20 VITALS — BP 116/80 | HR 82 | Temp 98.5°F | Resp 16 | Ht 67.0 in | Wt 208.0 lb

## 2021-04-20 DIAGNOSIS — Z0000 Encounter for general adult medical examination without abnormal findings: Secondary | ICD-10-CM | POA: Diagnosis not present

## 2021-04-20 DIAGNOSIS — O24419 Gestational diabetes mellitus in pregnancy, unspecified control: Secondary | ICD-10-CM

## 2021-04-20 LAB — CBC WITH DIFFERENTIAL/PLATELET
Basophils Absolute: 0 10*3/uL (ref 0.0–0.1)
Basophils Relative: 0.5 % (ref 0.0–3.0)
Eosinophils Absolute: 0.2 10*3/uL (ref 0.0–0.7)
Eosinophils Relative: 4.5 % (ref 0.0–5.0)
HCT: 38.2 % (ref 36.0–46.0)
Hemoglobin: 12.6 g/dL (ref 12.0–15.0)
Lymphocytes Relative: 42.5 % (ref 12.0–46.0)
Lymphs Abs: 1.7 10*3/uL (ref 0.7–4.0)
MCHC: 33.1 g/dL (ref 30.0–36.0)
MCV: 90.4 fl (ref 78.0–100.0)
Monocytes Absolute: 0.3 10*3/uL (ref 0.1–1.0)
Monocytes Relative: 7.7 % (ref 3.0–12.0)
Neutro Abs: 1.8 10*3/uL (ref 1.4–7.7)
Neutrophils Relative %: 44.8 % (ref 43.0–77.0)
Platelets: 202 10*3/uL (ref 150.0–400.0)
RBC: 4.22 Mil/uL (ref 3.87–5.11)
RDW: 14.9 % (ref 11.5–15.5)
WBC: 4 10*3/uL (ref 4.0–10.5)

## 2021-04-20 LAB — COMPREHENSIVE METABOLIC PANEL
ALT: 16 U/L (ref 0–35)
AST: 16 U/L (ref 0–37)
Albumin: 4.2 g/dL (ref 3.5–5.2)
Alkaline Phosphatase: 56 U/L (ref 39–117)
BUN: 14 mg/dL (ref 6–23)
CO2: 29 mEq/L (ref 19–32)
Calcium: 9.4 mg/dL (ref 8.4–10.5)
Chloride: 105 mEq/L (ref 96–112)
Creatinine, Ser: 0.79 mg/dL (ref 0.40–1.20)
GFR: 91.88 mL/min (ref >60.00)
Glucose, Bld: 118 mg/dL — ABNORMAL HIGH (ref 70–99)
Potassium: 4.3 mEq/L (ref 3.5–5.1)
Sodium: 138 mEq/L (ref 135–145)
Total Bilirubin: 0.4 mg/dL (ref 0.2–1.2)
Total Protein: 6.9 g/dL (ref 6.0–8.3)

## 2021-04-20 LAB — LIPID PANEL
Cholesterol: 139 mg/dL (ref 0–200)
HDL: 46.9 mg/dL (ref >39.00)
LDL Cholesterol: 84 mg/dL (ref 0–99)
NonHDL: 92.24
Total CHOL/HDL Ratio: 3
Triglycerides: 40 mg/dL (ref 0.0–149.0)
VLDL: 8 mg/dL (ref 0.0–40.0)

## 2021-04-20 LAB — TSH: TSH: 1 u[IU]/mL (ref 0.35–5.50)

## 2021-04-20 LAB — HEMOGLOBIN A1C: Hgb A1c MFr Bld: 5.8 % (ref 4.6–6.5)

## 2021-04-20 NOTE — Assessment & Plan Note (Signed)
Here for CPX.  ?History of gestational diabetes: Checking A1c. ?Snoring: Saw pulmonary 06/15/2020, Rx home sleep study, not done to my knowledge ?RTC 1 year ?

## 2021-04-20 NOTE — Patient Instructions (Addendum)
   GO TO THE LAB : Get the blood work     GO TO THE FRONT DESK, PLEASE SCHEDULE YOUR APPOINTMENTS Come back for a physical exam in 1 year 

## 2021-04-20 NOTE — Assessment & Plan Note (Signed)
-  Td 08-2010, declined a booster, will think about  ?- Covid shot x2, booster benefits d/w pt  ?- flu shot : declined before  ?-Female care: Per gyn, had a MMG 10-2020 ( [facility])   ?-CCS- not indicated  ?-Diet and exercise discussed.  ?-Labs: CMP, FLP, CBC, A1c, TSH. ? ?  ?

## 2021-04-20 NOTE — Progress Notes (Signed)
? ?Subjective:  ? ? Patient ID: [name] [name], female    DOB: [birth year], 41 y.o.   MRN: [id number] ? ?DOS:  04/20/2021 ?Type of visit - description: CPX ? ?Since the last office visit is doing well.  Has no major concerns ? ?Wt Readings from Last 3 Encounters:  ?04/20/21 208 lb (94.3 kg)  ?06/15/20 214 lb 9.6 oz (97.3 kg)  ?04/19/20 214 lb (97.1 kg)  ? ? ? ?Review of Systems ?Very rarely has stomach discomfort, denies fever or chills.  No nausea, diarrhea or blood in the stools. ?She has uterine fibroids, no abnormal bleeding. ? ? ?Other than above, a 14 point review of systems is negative  ? ? ? ?Past Medical History:  ?Diagnosis Date  ? AMA (advanced maternal age) multigravida 56+   ? Cholelithiases 11/2013  ? elevated LFTs  ? Gestational diabetes   ? Multiple allergies   ? chronic sneezing  ? Sickle cell trait ([place])   ? Uterine fibroid   ? Vaginal Pap smear, abnormal   ? ? ?Past Surgical History:  ?Procedure Laterality Date  ? CESAREAN SECTION WITH BILATERAL TUBAL LIGATION Bilateral 05/29/2013  ? Procedure: CESAREAN SECTION WITH BILATERAL TUBAL LIGATION;  Surgeon: Luz Lex, MD;  Location: [facility];  Service: Obstetrics;  Laterality: Bilateral;  PRIMARY ?EDC 4/30  ? leg discrepancy    ? LEG SURGERY    ? --R--from absecss with reconstructive flap- 2004  ? ?Social History  ? ?Socioeconomic History  ? Marital status: Married  ?  Spouse name: Not on file  ? Number of children: 4  ? Years of education: Not on file  ? Highest education level: Not on file  ?Occupational History  ? Occupation: Designer, jewellery, [employer]  ?Tobacco Use  ? Smoking status: Never  ? Smokeless tobacco: Never  ?Substance and Sexual Activity  ? Alcohol use: No  ? Drug use: No  ? Sexual activity: Not on file  ?Other Topics Concern  ? Not on file  ?Social History Narrative  ? Original from [place]   ? Household- pt, husband , 4 children at home   ? ?Social Determinants of Health  ? ?Financial Resource Strain: Not on file  ?Food  Insecurity: Not on file  ?Transportation Needs: Not on file  ?Physical Activity: Not on file  ?Stress: Not on file  ?Social Connections: Not on file  ?Intimate Partner Violence: Not on file  ? ? ?Current Outpatient Medications  ?Medication Instructions  ? amoxicillin-clavulanate (AUGMENTIN) 500-125 MG tablet 1 tablet, Oral, 3 times daily  ? ? ?   ?Objective:  ? Physical Exam ?BP 116/80 (BP Location: Left Arm, Patient Position: Sitting, Cuff Size: Small)   Pulse 82   Temp 98.5 ?F (36.9 ?C) (Oral)   Resp 16   Ht '5\' 7"'$  (1.702 m)   Wt 208 lb (94.3 kg)   SpO2 98%   BMI 32.58 kg/m?  ?General: ?Well developed, NAD, BMI noted ?Neck: No  thyromegaly  ?HEENT:  ?Normocephalic . Face symmetric, atraumatic ?Lungs:  ?CTA B ?Normal respiratory effort, no intercostal retractions, no accessory muscle use. ?Heart: RRR,  no murmur.  ?Abdomen:  ?Not distended, soft, non-tender. No rebound or rigidity.   ?Lower extremities: no pretibial edema bilaterally  ?Skin: Exposed areas without rash. Not pale. Not jaundice ?Neurologic:  ?alert & oriented X3.  ?Speech normal, gait appropriate for age and unassisted ?Strength symmetric and appropriate for age.  ?Psych: ?Cognition and judgment appear intact.  ?Cooperative with normal attention  span and concentration.  ?Behavior appropriate. ?No anxious or depressed appearing. ? ?   ?Assessment   ? ?  ?Assessment ?H/o Gestational diabetes ?Allergies ?Sickle cell trait ?2015: abd pain --> elevated LFTs --> US GB stones --> MRCP nl biliar tree, + GB stones , saw GI 11-2013, surgical referral failed ; saw GI 2017, then saw surgery 11-2015, HIDA (-), no surgery  ?BTL ? ?PLAN:  ?Here for CPX.  ?History of gestational diabetes: Checking A1c. ?Snoring: Saw pulmonary 06/15/2020, Rx home sleep study, not done to my knowledge ?RTC 1 year ? ? ?This visit occurred during the SARS-CoV-2 public health emergency.  Safety protocols were in place, including screening questions prior to the visit, additional usage  of staff PPE, and extensive cleaning of exam room while observing appropriate contact time as indicated for disinfecting solutions.  ? ?

## 2021-05-08 DIAGNOSIS — Z1231 Encounter for screening mammogram for malignant neoplasm of breast: Secondary | ICD-10-CM | POA: Diagnosis not present

## 2021-05-08 DIAGNOSIS — Z01419 Encounter for gynecological examination (general) (routine) without abnormal findings: Secondary | ICD-10-CM | POA: Diagnosis not present

## 2021-05-08 DIAGNOSIS — Z6833 Body mass index (BMI) 33.0-33.9, adult: Secondary | ICD-10-CM | POA: Diagnosis not present

## 2021-05-09 ENCOUNTER — Other Ambulatory Visit: Payer: Self-pay | Admitting: Obstetrics and Gynecology

## 2021-05-09 DIAGNOSIS — R928 Other abnormal and inconclusive findings on diagnostic imaging of breast: Secondary | ICD-10-CM

## 2021-05-21 ENCOUNTER — Ambulatory Visit
Admission: RE | Admit: 2021-05-21 | Discharge: 2021-05-21 | Disposition: A | Discharge status: Home / Self Care | Payer: BC Managed Care – PPO | Source: Ambulatory Visit | Attending: Obstetrics and Gynecology | Admitting: Obstetrics and Gynecology

## 2021-05-21 ENCOUNTER — Other Ambulatory Visit: Payer: Self-pay | Admitting: Obstetrics and Gynecology

## 2021-05-21 DIAGNOSIS — R921 Mammographic calcification found on diagnostic imaging of breast: Secondary | ICD-10-CM

## 2021-05-21 DIAGNOSIS — R928 Other abnormal and inconclusive findings on diagnostic imaging of breast: Secondary | ICD-10-CM

## 2021-05-22 ENCOUNTER — Ambulatory Visit
Admission: RE | Admit: 2021-05-22 | Discharge: 2021-05-22 | Disposition: A | Discharge status: Home / Self Care | Payer: BC Managed Care – PPO | Source: Ambulatory Visit | Attending: Obstetrics and Gynecology | Admitting: Obstetrics and Gynecology

## 2021-05-22 ENCOUNTER — Other Ambulatory Visit: Payer: Self-pay | Admitting: Obstetrics and Gynecology

## 2021-05-22 DIAGNOSIS — R921 Mammographic calcification found on diagnostic imaging of breast: Secondary | ICD-10-CM

## 2021-09-12 ENCOUNTER — Telehealth: Payer: Self-pay | Admitting: Pulmonary Disease

## 2021-09-13 DIAGNOSIS — M2170 Unequal limb length (acquired), unspecified site: Secondary | ICD-10-CM | POA: Diagnosis not present

## 2021-09-17 ENCOUNTER — Telehealth: Payer: Self-pay

## 2021-09-17 NOTE — Telephone Encounter (Signed)
Patient called and left a message about medication refills, but did not specify the medication needed. Called patient, NA, LMAM.

## 2021-09-20 NOTE — Telephone Encounter (Signed)
2nd attempt : Called patient, NA, LMAM

## 2021-09-28 DIAGNOSIS — H04123 Dry eye syndrome of bilateral lacrimal glands: Secondary | ICD-10-CM | POA: Diagnosis not present

## 2021-09-28 DIAGNOSIS — H40013 Open angle with borderline findings, low risk, bilateral: Secondary | ICD-10-CM | POA: Diagnosis not present

## 2021-11-21 ENCOUNTER — Ambulatory Visit
Admission: RE | Admit: 2021-11-21 | Discharge: 2021-11-21 | Disposition: A | Discharge status: Home / Self Care | Payer: BC Managed Care – PPO | Source: Ambulatory Visit | Attending: Obstetrics and Gynecology | Admitting: Obstetrics and Gynecology

## 2021-11-21 ENCOUNTER — Other Ambulatory Visit: Payer: Self-pay | Admitting: Obstetrics and Gynecology

## 2021-11-21 DIAGNOSIS — R921 Mammographic calcification found on diagnostic imaging of breast: Secondary | ICD-10-CM

## 2022-01-09 ENCOUNTER — Telehealth: Payer: Self-pay | Admitting: Internal Medicine

## 2022-01-09 NOTE — Telephone Encounter (Signed)
Pt wanted to know when she is due for a colonoscopy

## 2022-01-09 NOTE — Telephone Encounter (Signed)
LMOM informing cscopes will start at age 41 unless she has family hx of colon cancer.

## 2022-01-18 ENCOUNTER — Telehealth: Payer: Self-pay

## 2022-01-18 NOTE — Telephone Encounter (Signed)
Caller Name [place] Phone Number [phone] Patient Name [name] [name] Patient DOB Mar 30, 1980 Call Type Message Only Information Provided Reason for Call Request for General Office Information Initial Comment Caller states that she having cramping below her abdomen below her belly button. She is not her menstrual at this time . She completed her menstrual a week or 2 ago. She states that the cramping started a day or 2 ago. Her doctors name is any Paz, Jose-MD. [name] would like to know if she is needing to come in and be seen or if she needs to referred to her Gyno. Disp. Time Disposition Final User 01/18/2022 2:25:48 PM General Information Provided Yes Minna Merritts Call Closed By: Minna Merritts Transaction Date/Time: 01/18/2022 2:19:11 PM (ET)

## 2022-01-18 NOTE — Telephone Encounter (Signed)
Pt scheduled w/ GYN on 01/21/22.

## 2022-01-25 DIAGNOSIS — R52 Pain, unspecified: Secondary | ICD-10-CM | POA: Diagnosis not present

## 2022-01-25 DIAGNOSIS — D259 Leiomyoma of uterus, unspecified: Secondary | ICD-10-CM | POA: Diagnosis not present

## 2022-01-25 DIAGNOSIS — R102 Pelvic and perineal pain: Secondary | ICD-10-CM | POA: Diagnosis not present

## 2022-03-15 ENCOUNTER — Telehealth: Payer: Self-pay | Admitting: Internal Medicine

## 2022-03-15 NOTE — Telephone Encounter (Signed)
Notes below from [facility].

## 2022-03-15 NOTE — Telephone Encounter (Signed)
Pt stated she is no longer with her gastro dr and wondered if pcp could place another referral.

## 2022-03-15 NOTE — Telephone Encounter (Signed)
Miscellaneous Notes - documented in this encounter Table of Contents for Miscellaneous Notes  Telephone Encounter - Eustace Quail - 03/11/2022 4:38 PM EST  Telephone Encounter - Pola Corn - 03/11/2022 2:58 PM EST    Telephone Encounter - Eustace Quail - 03/11/2022 4:38 PM EST Formatting of this note might be different from the original. Yes, okay to schedule with Dr. Mallie Darting. Thanks! Electronically signed by Eustace Quail at 03/11/2022 4:39 PM EST  Back to top of Miscellaneous Notes Telephone Encounter - Pola Corn - 03/11/2022 2:58 PM EST Formatting of this note might be different from the original. Patient called wanting to schedule follow up appointment. Previous patient of Dr. Earlean Shawl. Okay to schedule with Dr. Mallie Darting  Phone: [phone]

## 2022-03-18 DIAGNOSIS — R1084 Generalized abdominal pain: Secondary | ICD-10-CM | POA: Diagnosis not present

## 2022-03-18 DIAGNOSIS — E010 Iodine-deficiency related diffuse (endemic) goiter: Secondary | ICD-10-CM | POA: Diagnosis not present

## 2022-03-18 DIAGNOSIS — R1031 Right lower quadrant pain: Secondary | ICD-10-CM | POA: Diagnosis not present

## 2022-03-28 DIAGNOSIS — R1031 Right lower quadrant pain: Secondary | ICD-10-CM | POA: Diagnosis not present

## 2022-03-28 DIAGNOSIS — R1084 Generalized abdominal pain: Secondary | ICD-10-CM | POA: Diagnosis not present

## 2022-04-22 ENCOUNTER — Encounter: Payer: BC Managed Care – PPO | Admitting: Internal Medicine

## 2022-04-22 ENCOUNTER — Telehealth: Payer: Self-pay | Admitting: Internal Medicine

## 2022-04-22 DIAGNOSIS — R739 Hyperglycemia, unspecified: Secondary | ICD-10-CM

## 2022-04-22 DIAGNOSIS — Z0000 Encounter for general adult medical examination without abnormal findings: Secondary | ICD-10-CM

## 2022-04-22 DIAGNOSIS — D219 Benign neoplasm of connective and other soft tissue, unspecified: Secondary | ICD-10-CM

## 2022-04-22 NOTE — Telephone Encounter (Signed)
Patient was late for CPX appointment, will reschedule. Will proceed with labs today: CMP FLP CBC A1c

## 2022-04-23 NOTE — Telephone Encounter (Signed)
Patient ended up not doing this and rescheduled the CPE appt for a few weeks out

## 2022-04-25 DIAGNOSIS — D259 Leiomyoma of uterus, unspecified: Secondary | ICD-10-CM | POA: Diagnosis not present

## 2022-04-25 DIAGNOSIS — R1013 Epigastric pain: Secondary | ICD-10-CM | POA: Diagnosis not present

## 2022-04-25 DIAGNOSIS — K8020 Calculus of gallbladder without cholecystitis without obstruction: Secondary | ICD-10-CM | POA: Diagnosis not present

## 2022-04-25 DIAGNOSIS — Z6835 Body mass index (BMI) 35.0-35.9, adult: Secondary | ICD-10-CM | POA: Diagnosis not present

## 2022-05-02 ENCOUNTER — Ambulatory Visit (INDEPENDENT_AMBULATORY_CARE_PROVIDER_SITE_OTHER): Payer: BC Managed Care – PPO | Admitting: Internal Medicine

## 2022-05-02 ENCOUNTER — Encounter: Payer: Self-pay | Admitting: Internal Medicine

## 2022-05-02 VITALS — BP 124/66 | HR 46 | Temp 98.2°F | Resp 16 | Ht 67.0 in | Wt 226.1 lb

## 2022-05-02 DIAGNOSIS — Z23 Encounter for immunization: Secondary | ICD-10-CM

## 2022-05-02 DIAGNOSIS — Z0001 Encounter for general adult medical examination with abnormal findings: Secondary | ICD-10-CM

## 2022-05-02 DIAGNOSIS — R1010 Upper abdominal pain, unspecified: Secondary | ICD-10-CM

## 2022-05-02 DIAGNOSIS — Z0000 Encounter for general adult medical examination without abnormal findings: Secondary | ICD-10-CM

## 2022-05-02 DIAGNOSIS — E669 Obesity, unspecified: Secondary | ICD-10-CM

## 2022-05-02 DIAGNOSIS — R739 Hyperglycemia, unspecified: Secondary | ICD-10-CM | POA: Diagnosis not present

## 2022-05-02 LAB — COMPREHENSIVE METABOLIC PANEL
ALT: 17 U/L (ref 0–35)
AST: 16 U/L (ref 0–37)
Albumin: 4.4 g/dL (ref 3.5–5.2)
Alkaline Phosphatase: 65 U/L (ref 39–117)
BUN: 12 mg/dL (ref 6–23)
CO2: 30 mEq/L (ref 19–32)
Calcium: 9.5 mg/dL (ref 8.4–10.5)
Chloride: 103 mEq/L (ref 96–112)
Creatinine, Ser: 0.74 mg/dL (ref 0.40–1.20)
GFR: 98.66 mL/min (ref >60.00)
Glucose, Bld: 139 mg/dL — ABNORMAL HIGH (ref 70–99)
Potassium: 4.2 mEq/L (ref 3.5–5.1)
Sodium: 138 mEq/L (ref 135–145)
Total Bilirubin: 0.4 mg/dL (ref 0.2–1.2)
Total Protein: 7.6 g/dL (ref 6.0–8.3)

## 2022-05-02 LAB — CBC WITH DIFFERENTIAL/PLATELET
Basophils Absolute: 0 10*3/uL (ref 0.0–0.1)
Basophils Relative: 0.5 % (ref 0.0–3.0)
Eosinophils Absolute: 0.1 10*3/uL (ref 0.0–0.7)
Eosinophils Relative: 2.6 % (ref 0.0–5.0)
HCT: 39 % (ref 36.0–46.0)
Hemoglobin: 12.8 g/dL (ref 12.0–15.0)
Lymphocytes Relative: 39.7 % (ref 12.0–46.0)
Lymphs Abs: 1.9 10*3/uL (ref 0.7–4.0)
MCHC: 32.9 g/dL (ref 30.0–36.0)
MCV: 90.7 fl (ref 78.0–100.0)
Monocytes Absolute: 0.3 10*3/uL (ref 0.1–1.0)
Monocytes Relative: 6.9 % (ref 3.0–12.0)
Neutro Abs: 2.4 10*3/uL (ref 1.4–7.7)
Neutrophils Relative %: 50.3 % (ref 43.0–77.0)
Platelets: 233 10*3/uL (ref 150.0–400.0)
RBC: 4.3 Mil/uL (ref 3.87–5.11)
RDW: 14.9 % (ref 11.5–15.5)
WBC: 4.8 10*3/uL (ref 4.0–10.5)

## 2022-05-02 LAB — LIPID PANEL
Cholesterol: 156 mg/dL (ref 0–200)
HDL: 47.6 mg/dL (ref >39.00)
LDL Cholesterol: 95 mg/dL (ref 0–99)
NonHDL: 108.84
Total CHOL/HDL Ratio: 3
Triglycerides: 70 mg/dL (ref 0.0–149.0)
VLDL: 14 mg/dL (ref 0.0–40.0)

## 2022-05-02 LAB — HEMOGLOBIN A1C: Hgb A1c MFr Bld: 6.6 % — ABNORMAL HIGH (ref 4.6–6.5)

## 2022-05-02 MED ORDER — ZEPBOUND 2.5 MG/0.5ML ~~LOC~~ SOAJ: 2.5000 mg | SUBCUTANEOUS | 0 refills | Status: DC

## 2022-05-02 NOTE — Assessment & Plan Note (Signed)
-  Td 08-2010 - vax I recommend: Td, covid, flu shot q fall  -Female care: Per gyn, had a MMG 11-2021 ( [facility])   -CCS- not indicated  -Diet and exercise reportedly doing well.  See comments under obesity. -Labs: CMP, FLP, CBC, A1c

## 2022-05-02 NOTE — Patient Instructions (Addendum)
Vaccines I recommend:  Covid booster Tdap (tetanus) Flu shot every fall  For your chronic abdominal pain: Will refer you to [facility] gastroenterology  Were sending a prescription for a weight loss injection  Use it weekly. Let us know how you are doing in 4 weeks. You need to continue exercising and eating healthy. If you are not able to get the injection and use it, I need to see you in 2 months.      GO TO THE LAB : Get the blood work     GO TO THE FRONT DESK, [place] Come back for   in 2 months if you start taking the weight loss medicine.

## 2022-05-02 NOTE — Assessment & Plan Note (Signed)
Here for CPX.  Abdominal pain: Chronic issue, extensively evaluated in the past.  Request again to see gastroenterology. If it is determined that her symptoms are gallbladder related she will still be very reluctant to get a cholecystectomy.  She just like something to "dissolve" the gallbladder stones. Obesity: Reports she is doing her best with diet and exercise, unable to lose weight, recommend to see the wellness clinic, declines, requesting medication.  Will try Zepboun. OSA?  Gynecology noted heavy snoring, they refer her to sleep medicine. RTC 2 m

## 2022-05-02 NOTE — Progress Notes (Signed)
Subjective:    Patient ID: [name] [name], female    DOB: 24-Aug-1980, 42 y.o.   MRN: [id number]  DOS:  05/02/2022 Type of visit - description: CPX Here for CPX Continue with chronic, daily upper abdominal discomfort.  Worse in the morning than in the afternoon, typically preprandial. No nausea vomiting.  No diarrhea.  No constipation.  No blood in the stool or in the urine  Request weight loss medication.  Wt Readings from Last 3 Encounters:  05/02/22 226 lb 2 oz (102.6 kg)  04/20/21 208 lb (94.3 kg)  06/15/20 214 lb 9.6 oz (97.3 kg)    Review of Systems  Other than above, a 14 point review of systems is negative     Past Medical History:  Diagnosis Date   AMA (advanced maternal age) multigravida 35+    Cholelithiases 11/2013   elevated LFTs   Gestational diabetes    Multiple allergies    chronic sneezing   Sickle cell trait ([place])    Uterine fibroid    Vaginal Pap smear, abnormal     Past Surgical History:  Procedure Laterality Date   CESAREAN SECTION WITH BILATERAL TUBAL LIGATION Bilateral 05/29/2013   Procedure: CESAREAN SECTION WITH BILATERAL TUBAL LIGATION;  Surgeon: Luz Lex, MD;  Location: [facility];  Service: Obstetrics;  Laterality: Bilateral;  PRIMARY EDC 4/30   leg discrepancy     LEG SURGERY     --R--from absecss with reconstructive flap- 2004   Social History   Socioeconomic History   Marital status: Married    Spouse name: Not on file   Number of children: 4   Years of education: Not on file   Highest education level: Not on file  Occupational History   Occupation: Designer, jewellery  Tobacco Use   Smoking status: Never   Smokeless tobacco: Never  Substance and Sexual Activity   Alcohol use: No   Drug use: No   Sexual activity: Not on file  Other Topics Concern   Not on file  Social History Narrative   Original from [place]    Household- pt, husband , 4 children at home    Social Determinants of Health   Financial Resource Strain:  Not on file  Food Insecurity: Not on file  Transportation Needs: Not on file  Physical Activity: Not on file  Stress: Not on file  Social Connections: Not on file  Intimate Partner Violence: Not on file    Current Outpatient Medications  Medication Instructions   Zepbound 2.5 mg, Subcutaneous, Weekly       Objective:   Physical Exam BP 124/66   Pulse (!) 46   Temp 98.2 F (36.8 C) (Oral)   Resp 16   Ht 5\' 7"  (1.702 m)   Wt 226 lb 2 oz (102.6 kg)   SpO2 97%   BMI 35.42 kg/m  General: Well developed, NAD, BMI noted Neck: No  thyromegaly  HEENT:  Normocephalic . Face symmetric, atraumatic Lungs:  CTA B Normal respiratory effort, no intercostal retractions, no accessory muscle use. Heart: RRR,  no murmur.  Abdomen:  Not distended, soft, non-tender. No rebound or rigidity.   Skin: Exposed areas without rash. Not pale. Not jaundice Neurologic:  alert & oriented X3.  Speech normal, gait appropriate for age and unassisted Strength symmetric and appropriate for age.  Psych: Cognition and judgment appear intact.  Cooperative with normal attention span and concentration.  Behavior appropriate. No anxious or depressed appearing.     Assessment  Assessment H/o Gestational diabetes Allergies Sickle cell trait 2015: abd pain --> elevated LFTs --> US GB stones --> MRCP nl biliar tree, + GB stones , saw GI 11-2013, surgical referral failed ; saw GI 2017, then saw surgery 11-2015, HIDA (-), no surgery  BTL  PLAN:  Here for CPX.  Abdominal pain: Chronic issue, extensively evaluated in the past.  Request again to see gastroenterology. If it is determined that her symptoms are gallbladder related she will still be very reluctant to get a cholecystectomy.  She just like something to "dissolve" the gallbladder stones. Obesity: Reports she is doing her best with diet and exercise, unable to lose weight, recommend to see the wellness clinic, declines, requesting medication.   Will try Zepboun. OSA?  Gynecology noted heavy snoring, they refer her to sleep medicine. RTC 2 m

## 2022-05-07 DIAGNOSIS — E119 Type 2 diabetes mellitus without complications: Secondary | ICD-10-CM | POA: Insufficient documentation

## 2022-05-21 ENCOUNTER — Other Ambulatory Visit (HOSPITAL_COMMUNITY): Payer: Self-pay

## 2022-05-22 ENCOUNTER — Telehealth: Payer: Self-pay | Admitting: Internal Medicine

## 2022-05-22 ENCOUNTER — Telehealth: Payer: Self-pay | Admitting: *Deleted

## 2022-05-22 DIAGNOSIS — Z124 Encounter for screening for malignant neoplasm of cervix: Secondary | ICD-10-CM | POA: Diagnosis not present

## 2022-05-22 DIAGNOSIS — Z6835 Body mass index (BMI) 35.0-35.9, adult: Secondary | ICD-10-CM | POA: Diagnosis not present

## 2022-05-22 DIAGNOSIS — Z01419 Encounter for gynecological examination (general) (routine) without abnormal findings: Secondary | ICD-10-CM | POA: Diagnosis not present

## 2022-05-22 DIAGNOSIS — Z1151 Encounter for screening for human papillomavirus (HPV): Secondary | ICD-10-CM | POA: Diagnosis not present

## 2022-05-22 MED ORDER — ZEPBOUND 2.5 MG/0.5ML ~~LOC~~ SOAJ: 2.5000 mg | SUBCUTANEOUS | 0 refills | Status: DC

## 2022-05-22 NOTE — Telephone Encounter (Signed)
Prior auth started via cover my meds.  Awaiting determination.  Key: [id number]

## 2022-05-22 NOTE — Telephone Encounter (Signed)
Rx sent to [facility]

## 2022-05-22 NOTE — Addendum Note (Signed)
Addended byConrad Sunman D on: 05/22/2022 04:10 PM   Modules accepted: Orders

## 2022-05-22 NOTE — Telephone Encounter (Signed)
Pt called stating that the Zepbound she is on is on backorder with [facility] and needs to have it rerouted to the following pharmacy:  [facility] [address] [place], [place] [address] P: [phone]

## 2022-05-23 NOTE — Telephone Encounter (Signed)
PA approved.   [id number];Review Type:Prior Auth;Coverage Start Date:04/23/2022;Coverage End Date:01/18/2023;

## 2022-05-24 ENCOUNTER — Other Ambulatory Visit: Payer: Self-pay | Admitting: Obstetrics and Gynecology

## 2022-05-24 ENCOUNTER — Encounter: Payer: Self-pay | Admitting: Obstetrics and Gynecology

## 2022-05-24 ENCOUNTER — Ambulatory Visit
Admission: RE | Admit: 2022-05-24 | Discharge: 2022-05-24 | Disposition: A | Discharge status: Home / Self Care | Payer: BC Managed Care – PPO | Source: Ambulatory Visit | Attending: Obstetrics and Gynecology | Admitting: Obstetrics and Gynecology

## 2022-05-24 DIAGNOSIS — R921 Mammographic calcification found on diagnostic imaging of breast: Secondary | ICD-10-CM

## 2022-05-27 ENCOUNTER — Encounter: Payer: Self-pay | Admitting: Internal Medicine

## 2022-05-27 DIAGNOSIS — R1013 Epigastric pain: Secondary | ICD-10-CM | POA: Diagnosis not present

## 2022-05-27 DIAGNOSIS — Z1211 Encounter for screening for malignant neoplasm of colon: Secondary | ICD-10-CM | POA: Diagnosis not present

## 2022-06-02 DIAGNOSIS — K200 Eosinophilic esophagitis: Secondary | ICD-10-CM | POA: Diagnosis not present

## 2022-07-02 ENCOUNTER — Encounter: Payer: Self-pay | Admitting: Internal Medicine

## 2022-07-02 ENCOUNTER — Ambulatory Visit (INDEPENDENT_AMBULATORY_CARE_PROVIDER_SITE_OTHER): Payer: BC Managed Care – PPO | Admitting: Internal Medicine

## 2022-07-02 VITALS — BP 116/84 | HR 95 | Temp 98.0°F | Resp 16 | Ht 67.0 in | Wt 228.5 lb

## 2022-07-02 DIAGNOSIS — E669 Obesity, unspecified: Secondary | ICD-10-CM

## 2022-07-02 DIAGNOSIS — Z6835 Body mass index (BMI) 35.0-35.9, adult: Secondary | ICD-10-CM | POA: Diagnosis not present

## 2022-07-02 DIAGNOSIS — A048 Other specified bacterial intestinal infections: Secondary | ICD-10-CM | POA: Diagnosis not present

## 2022-07-02 DIAGNOSIS — K2270 Barrett's esophagus without dysplasia: Secondary | ICD-10-CM | POA: Diagnosis not present

## 2022-07-02 MED ORDER — ZEPBOUND 7.5 MG/0.5ML ~~LOC~~ SOAJ: 7.5000 mg | SUBCUTANEOUS | 0 refills | Status: DC

## 2022-07-02 MED ORDER — ZEPBOUND 5 MG/0.5ML ~~LOC~~ SOAJ: 5.0000 mg | SUBCUTANEOUS | 0 refills | Status: DC

## 2022-07-02 NOTE — Patient Instructions (Addendum)
Zepbound  instructions:  5 mg weekly x 4 weeks  After that use is about 7.5 mg weekly x4 week  Then  10 mg weekly  Start counting your calories daily    Schedule a visit in 3 months from today.     Per our records you are due for your diabetic eye exam. Please contact your eye doctor to schedule an appointment. Please have them send copies of your office visit notes to [place]. Our fax number is [phone]. If you need a referral to an eye doctor please let us know.

## 2022-07-02 NOTE — Progress Notes (Signed)
   Subjective:    Patient ID: [name] [name], female    DOB: 30-Jul-1980, 42 y.o.   MRN: [id number]  DOS:  07/02/2022 Type of visit - description: f/u  Since the last office visit started Zepbound. Reports no nausea vomiting.  No diarrhea or abdominal pain. Her last menstrual period  was somewhat irregular. Reports is  doing well with diet.  Wt Readings from Last 3 Encounters:  07/02/22 228 lb 8 oz (103.6 kg)  05/02/22 226 lb 2 oz (102.6 kg)  04/20/21 208 lb (94.3 kg)     Review of Systems See above   Past Medical History:  Diagnosis Date   AMA (advanced maternal age) multigravida 35+    Cholelithiases 11/2013   elevated LFTs   Gestational diabetes    Multiple allergies    chronic sneezing   Sickle cell trait ([facility])    Uterine fibroid    Vaginal Pap smear, abnormal     Past Surgical History:  Procedure Laterality Date   CESAREAN SECTION WITH BILATERAL TUBAL LIGATION Bilateral 05/29/2013   Procedure: CESAREAN SECTION WITH BILATERAL TUBAL LIGATION;  Surgeon: Turner Daniels, MD;  Location: [facility];  Service: Obstetrics;  Laterality: Bilateral;  PRIMARY EDC 4/30   leg discrepancy     LEG SURGERY     --R--from absecss with reconstructive flap- 2004    Current Outpatient Medications  Medication Instructions   Zepbound 2.5 mg, Subcutaneous, Weekly       Objective:   Physical Exam BP 116/84   Pulse 95   Temp 98 F (36.7 C) (Oral)   Resp 16   Ht 5\' 7"  (1.702 m)   Wt 228 lb 8 oz (103.6 kg)   LMP 05/06/2022 (Approximate)   SpO2 96%   BMI 35.79 kg/m  General:   Well developed, NAD, BMI noted. HEENT:  Normocephalic . Face symmetric, atraumatic Skin: Not pale. Not jaundice Neurologic:  alert & oriented X3.  Speech normal, gait appropriate for age and unassisted Psych--  Cognition and judgment appear intact.  Cooperative with normal attention span and concentration.  Behavior appropriate. No anxious or depressed appearing.      Assessment     Assessment H/o  Gestational diabetes Allergies Sickle cell trait 2015: abd pain --> elevated LFTs --> US GB stones --> MRCP nl biliar tree, + GB stones , saw GI 11-2013, surgical referral failed; saw GI 2017, then saw surgery 11-2015, HIDA (-), no surgery  BTL  PLAN:  Obesity: Started zepbound, had weekly 2.5 mg injections, tolerates well.  On her own scale has lost a couple of pounds. Reports she continues to be very active and eating healthy. Recommend to objectively see how her diet is by counting calories and -if needed-  reduce her portions. Will increase Zepbound  doses gradually to 5 mg, 7.9 mg and 10 mg weekly. OSA?  Was referred for a sleep study, that did not happen, she is hesitant to proceed.  States she is snoring less. Last menstrual period was slightly different, unlikely to be related to zepbound; rec  to discuss with gynecology. RTC 3 months

## 2022-07-03 NOTE — Assessment & Plan Note (Signed)
Obesity: Started zepbound, had weekly 2.5 mg injections, tolerates well.  On her own scale has lost a couple of pounds. Reports she continues to be very active and eating healthy. Recommend to objectively see how her diet is by counting calories and -if needed-  reduce her portions. Will increase Zepbound  doses gradually to 5 mg, 7.9 mg and 10 mg weekly. OSA?  Was referred for a sleep study, that did not happen, she is hesitant to proceed.  States she is snoring less. Last menstrual period was slightly different, unlikely to be related to zepbound; rec  to discuss with gynecology. RTC 3 months

## 2022-08-08 ENCOUNTER — Telehealth: Payer: Self-pay | Admitting: *Deleted

## 2022-08-08 NOTE — Telephone Encounter (Signed)
Left message on machine to call back.  Looks like patient should have been triaged but was not.    Need to check her symptoms, how long, pain scale, etc.  Advised on vm that she may not need to wait til tomorrow and to proceed to UC or ER.

## 2022-08-09 ENCOUNTER — Ambulatory Visit (INDEPENDENT_AMBULATORY_CARE_PROVIDER_SITE_OTHER): Payer: BC Managed Care – PPO | Admitting: Internal Medicine

## 2022-08-09 VITALS — BP 110/68 | HR 94 | Temp 98.4°F | Resp 18 | Wt 220.0 lb

## 2022-08-09 DIAGNOSIS — R1031 Right lower quadrant pain: Secondary | ICD-10-CM

## 2022-08-09 DIAGNOSIS — Z6834 Body mass index (BMI) 34.0-34.9, adult: Secondary | ICD-10-CM

## 2022-08-09 DIAGNOSIS — E669 Obesity, unspecified: Secondary | ICD-10-CM | POA: Diagnosis not present

## 2022-08-09 LAB — POCT URINALYSIS DIP (MANUAL ENTRY)
Bilirubin, UA: NEGATIVE
Blood, UA: NEGATIVE
Glucose, UA: NEGATIVE mg/dL
Leukocytes, UA: NEGATIVE
Nitrite, UA: NEGATIVE
Protein Ur, POC: NEGATIVE mg/dL
Spec Grav, UA: 1.015 (ref 1.010–1.025)
Urobilinogen, UA: 0.2 E.U./dL
pH, UA: 5 (ref 5.0–8.0)

## 2022-08-09 LAB — POCT URINE PREGNANCY: Preg Test, Ur: NEGATIVE

## 2022-08-09 NOTE — Progress Notes (Unsigned)
Subjective:    Patient ID: [name] [name], female    DOB: May 12, 1980, 42 y.o.   MRN: [id number]  DOS:  08/09/2022 Type of visit - description: Acute  Here for evaluation of abdominal pain, going on for at least 3 months, increase for 3 weeks. She points to the right lower quadrant of the abdomen. Ibuprofen helps temporarily. Pain does not change with food or BMs. Pain worse with certain movements or stretching  No fever or chills Appetite is okay, no weight loss. No nausea vomiting.  No diarrhea or blood in the stools.  Bowel movements are normal.  No GERD. No rash anywhere in the abdomen Denies dysuria, gross hematuria or difficulty urinating No vaginal discharge or bleeding. LMP slightly over 4 weeks ago.   Review of Systems See above   Past Medical History:  Diagnosis Date   AMA (advanced maternal age) multigravida 35+    Cholelithiases 11/2013   elevated LFTs   Gestational diabetes    Multiple allergies    chronic sneezing   Sickle cell trait ([facility])    Uterine fibroid    Vaginal Pap smear, abnormal     Past Surgical History:  Procedure Laterality Date   CESAREAN SECTION WITH BILATERAL TUBAL LIGATION Bilateral 05/29/2013   Procedure: CESAREAN SECTION WITH BILATERAL TUBAL LIGATION;  Surgeon: Turner Daniels, MD;  Location: [facility];  Service: Obstetrics;  Laterality: Bilateral;  PRIMARY EDC 4/30   leg discrepancy     LEG SURGERY     --R--from absecss with reconstructive flap- 2004    No current outpatient medications     Objective:   Physical Exam BP 110/68 (BP Location: Right Arm, Patient Position: Sitting)   Pulse 94   Temp 98.4 F (36.9 C) (Oral)   Resp 18   Wt 220 lb (99.8 kg)   SpO2 99%   BMI 34.46 kg/m  General:   Well developed, NAD, BMI noted.  HEENT:  Normocephalic . Face symmetric, atraumatic Lungs:  CTA B Normal respiratory effort, no intercostal retractions, no accessory muscle use. Heart: RRR,  no murmur.  Abdomen:  Not distended, soft,  was slightly tender at the mid right lower quadrant.  Subsequently I did a deep palpation at the distal RLQ and there was no pain mass or rebound. Groins: No hernias. Skin: Not pale. Not jaundice Lower extremities: no pretibial edema bilaterally  Neurologic:  alert & oriented X3.  Speech normal, gait appropriate for age and unassisted Psych--  Cognition and judgment appear intact.  Cooperative with normal attention span and concentration.  Behavior appropriate. No anxious or depressed appearing.     Assessment    Assessment H/o Gestational diabetes Allergies Sickle cell trait 2015: abd pain --> elevated LFTs --> US GB stones --> MRCP nl biliar tree, + GB stones , saw GI 11-2013, surgical referral failed; saw GI 2017, then saw surgery 11-2015, HIDA (-), no surgery  BTL  PLAN:  Lower abdominal pain: In the past, she had  a persistent  upper abdominal pain. The pain is now at the  lower abdomen, review of systems is essentially benign.  No LUTS, no vaginal discharge. Doubt acute appendicitis or other acute issues because the pain started about 3 months ago. UA negative, UPT neg She tells me that she went to see a GI doctor at [facility], had EGD. Plan:  CMP CBC, CT abdomen and pelvis without IV contrast, see allergist Recommend to reach out to GI again, tell them she is still hurting. Needs  to see gynecology ER if symptoms severe. If workup negative, could be MSK issue, hip related?  (States that pain is worse when she lifts or is stretching in certain ways). Addendum: Upon her arrival, she said is not taking any medication however at the end of the visit she said that she is indeed taking Zepbound, recommend to stop it for a month, that may cause abdominal pain.  She states she will not stop GLP-1's .   Plan: Will not RF GLP-1's. Obesity: See above Has an appointment scheduled for 04-2023

## 2022-08-09 NOTE — Patient Instructions (Addendum)
Proceed with blood work  Will arrange for CT of the abdomen and pelvis next week  Please talk again with your gastroenterologist and let them know you are still hurting.  You need to see your gynecologist within the next couple of weeks.  If you need a referral let us know  ER if: Severe symptoms, fever chills, severe constipation or nausea or vomiting.

## 2022-08-10 LAB — COMPREHENSIVE METABOLIC PANEL
AG Ratio: 1.4 (calc) (ref 1.0–2.5)
ALT: 20 U/L (ref 6–29)
AST: 16 U/L (ref 10–30)
Albumin: 4.2 g/dL (ref 3.6–5.1)
Alkaline phosphatase (APISO): 58 U/L (ref 31–125)
BUN: 18 mg/dL (ref 7–25)
CO2: 24 mmol/L (ref 20–32)
Calcium: 9.9 mg/dL (ref 8.6–10.2)
Chloride: 105 mmol/L (ref 98–110)
Creat: 0.81 mg/dL (ref 0.50–0.99)
Globulin: 2.9 g/dL (calc) (ref 1.9–3.7)
Glucose, Bld: 93 mg/dL (ref 65–99)
Potassium: 4 mmol/L (ref 3.5–5.3)
Sodium: 137 mmol/L (ref 135–146)
Total Bilirubin: 0.3 mg/dL (ref 0.2–1.2)
Total Protein: 7.1 g/dL (ref 6.1–8.1)

## 2022-08-10 LAB — URINALYSIS, ROUTINE W REFLEX MICROSCOPIC
Bilirubin Urine: NEGATIVE
Glucose, UA: NEGATIVE
Hgb urine dipstick: NEGATIVE
Leukocytes,Ua: NEGATIVE
Nitrite: NEGATIVE
Protein, ur: NEGATIVE
Specific Gravity, Urine: 1.016 (ref 1.001–1.035)
pH: 5.5 (ref 5.0–8.0)

## 2022-08-10 LAB — CBC WITH DIFFERENTIAL/PLATELET
Absolute Monocytes: 498 cells/uL (ref 200–950)
Basophils Absolute: 22 cells/uL (ref 0–200)
Basophils Relative: 0.4 %
Eosinophils Absolute: 157 cells/uL (ref 15–500)
Eosinophils Relative: 2.8 %
HCT: 37.1 % (ref 35.0–45.0)
Hemoglobin: 12.1 g/dL (ref 11.7–15.5)
Lymphs Abs: 1938 cells/uL (ref 850–3900)
MCH: 29.4 pg (ref 27.0–33.0)
MCHC: 32.6 g/dL (ref 32.0–36.0)
MCV: 90 fL (ref 80.0–100.0)
MPV: 12.4 fL (ref 7.5–12.5)
Monocytes Relative: 8.9 %
Neutro Abs: 2985 cells/uL (ref 1500–7800)
Neutrophils Relative %: 53.3 %
Platelets: 203 10*3/uL (ref 140–400)
RBC: 4.12 10*6/uL (ref 3.80–5.10)
RDW: 14.1 % (ref 11.0–15.0)
Total Lymphocyte: 34.6 %
WBC: 5.6 10*3/uL (ref 3.8–10.8)

## 2022-08-10 LAB — MICROALBUMIN / CREATININE URINE RATIO
Creatinine, Urine: 110 mg/dL (ref 20–275)
Microalb Creat Ratio: 17 mg/g creat (ref <30)
Microalb, Ur: 1.9 mg/dL

## 2022-08-10 LAB — URINE CULTURE
MICRO NUMBER:: 15140637
SPECIMEN QUALITY:: ADEQUATE

## 2022-08-11 NOTE — Assessment & Plan Note (Signed)
Lower abdominal pain: In the past, she had  a persistent  upper abdominal pain. The pain is now at the  lower abdomen, review of systems is essentially benign.  No LUTS, no vaginal discharge. Doubt acute appendicitis or other acute issues because the pain started about 3 months ago. UA negative, UPT neg She tells me that she went to see a GI doctor at [facility], had EGD. Plan:  CMP CBC, CT abdomen and pelvis without IV contrast, see allergist Recommend to reach out to GI again, tell them she is still hurting. Needs to see gynecology ER if symptoms severe. If workup negative, could be MSK issue, hip related?  (States that pain is worse when she lifts or is stretching in certain ways). Addendum: Upon her arrival, she said is not taking any medication however at the end of the visit she said that she is indeed taking Zepbound, recommend to stop it for a month, that may cause abdominal pain.  She states she will not stop GLP-1's .   Plan: Will not RF GLP-1's. Obesity: See above Has an appointment scheduled for 04-2023

## 2022-08-12 ENCOUNTER — Ambulatory Visit (HOSPITAL_BASED_OUTPATIENT_CLINIC_OR_DEPARTMENT_OTHER)
Admission: RE | Admit: 2022-08-12 | Discharge: 2022-08-12 | Disposition: A | Discharge status: Home / Self Care | Payer: BC Managed Care – PPO | Source: Ambulatory Visit | Attending: Internal Medicine | Admitting: Internal Medicine

## 2022-08-12 DIAGNOSIS — R1031 Right lower quadrant pain: Secondary | ICD-10-CM | POA: Diagnosis not present

## 2022-08-12 DIAGNOSIS — D252 Subserosal leiomyoma of uterus: Secondary | ICD-10-CM | POA: Diagnosis not present

## 2022-08-12 DIAGNOSIS — K429 Umbilical hernia without obstruction or gangrene: Secondary | ICD-10-CM | POA: Diagnosis not present

## 2022-08-12 DIAGNOSIS — K8020 Calculus of gallbladder without cholecystitis without obstruction: Secondary | ICD-10-CM | POA: Diagnosis not present

## 2022-08-12 NOTE — Addendum Note (Signed)
Addended byConrad [place] D on: 08/12/2022 10:57 AM   Modules accepted: Orders

## 2022-08-14 ENCOUNTER — Telehealth: Payer: Self-pay

## 2022-08-14 NOTE — Telephone Encounter (Signed)
Spoke w/ Pt- made her aware of CT results and recommendations. CT report faxed to Dr. Henderson Cloud, GYN.

## 2022-08-17 ENCOUNTER — Other Ambulatory Visit: Payer: Self-pay | Admitting: Internal Medicine

## 2022-08-21 DIAGNOSIS — R1031 Right lower quadrant pain: Secondary | ICD-10-CM | POA: Diagnosis not present

## 2022-08-21 DIAGNOSIS — Z83719 Family history of colon polyps, unspecified: Secondary | ICD-10-CM | POA: Diagnosis not present

## 2022-08-21 DIAGNOSIS — Z1211 Encounter for screening for malignant neoplasm of colon: Secondary | ICD-10-CM | POA: Diagnosis not present

## 2022-08-21 DIAGNOSIS — A048 Other specified bacterial intestinal infections: Secondary | ICD-10-CM | POA: Diagnosis not present

## 2022-08-27 DIAGNOSIS — Z1211 Encounter for screening for malignant neoplasm of colon: Secondary | ICD-10-CM | POA: Diagnosis not present

## 2022-08-27 LAB — HM COLONOSCOPY

## 2022-10-04 ENCOUNTER — Encounter: Payer: Self-pay | Admitting: Family Medicine

## 2022-10-04 ENCOUNTER — Ambulatory Visit: Payer: BC Managed Care – PPO | Admitting: Family Medicine

## 2022-10-04 VITALS — BP 120/78 | HR 78 | Temp 98.0°F | Ht 67.0 in | Wt 201.0 lb

## 2022-10-04 DIAGNOSIS — T300 Burn of unspecified body region, unspecified degree: Secondary | ICD-10-CM | POA: Diagnosis not present

## 2022-10-04 MED ORDER — TRAMADOL HCL 50 MG PO TABS: 50.0000 mg | ORAL_TABLET | Freq: Three times a day (TID) | ORAL | 0 refills | Status: AC | PRN

## 2022-10-04 NOTE — Progress Notes (Signed)
Chief Complaint  Patient presents with   burn from a hot water bottle    [name] [name] is a 42 y.o. female here for a skin complaint.  Duration: 1 day Location: R buttock, low back Pruritic? No Painful?  Yes Drainage? No Burned with hot water bag yesterday.  Other associated symptoms: Blistering Therapies tried thus far: none  Past Medical History:  Diagnosis Date   AMA (advanced maternal age) multigravida 35+    Cholelithiases 11/2013   elevated LFTs   Gestational diabetes    Multiple allergies    chronic sneezing   Sickle cell trait (HCC)    Uterine fibroid    Vaginal Pap smear, abnormal     BP 120/78 (BP Location: Left Arm, Patient Position: Sitting, Cuff Size: Large)   Pulse 78   Temp 98 F (36.7 C) (Oral)   Ht 5\' 7"  (1.702 m)   Wt 201 lb (91.2 kg)   SpO2 97%   BMI 31.48 kg/m  Gen: awake, alert, appearing stated age Lungs: No accessory muscle use Skin: Over her right lateral buttock proximally and right lumbar spine, there is a patch of hyperpigmentation with scattered bullae.  There is no excessive warmth, scaling, fluctuance, excoriation, or drainage. Psych: Age appropriate judgment and insight  Burn  Discussed care of her burn.  I advised her not to pop her blisters.  This should steadily resolve over the next couple weeks.  The hyperpigmentation of the skin should take considerably longer.  This appears to be a first-degree burn.  No signs of infection.  Warning signs and symptoms verbalized.  Consider an nonscented topical ointment to help with moisturization.  Will send in tramadol for pain.  Warning signs for this medicine verbalized and written down. F/u prn. The patient voiced understanding and agreement to the plan.  Jilda Roche [place], DO 10/04/22 12:19 PM

## 2022-10-04 NOTE — Patient Instructions (Addendum)
Do not shower for the rest of the day. When you do wash it, use only soap and water. Do not vigorously scrub. Keep the area clean and dry.   Things to look out for: increasing pain not relieved by ibuprofen/acetaminophen, fevers, spreading redness, drainage of pus, or foul odor.  Consider applying a twice daily and non-scented emollient (Aveeno, Vaseline, Aquaphor, Cerave) to help with the healing process.  Do not drink alcohol, do any illicit/street drugs, drive or do anything that requires alertness while on this medicine.   Let us know if you need anything.

## 2022-10-10 ENCOUNTER — Telehealth: Payer: Self-pay | Admitting: Internal Medicine

## 2022-10-10 NOTE — Telephone Encounter (Signed)
Please advise 

## 2022-10-10 NOTE — Telephone Encounter (Signed)
Pt saw Dr.Wendling 8/23 and is requesting medication to help with her burn. States it is called silvadene cream. Please advise.     [facility] [address] [place], [place] [address]

## 2022-10-10 NOTE — Telephone Encounter (Signed)
Spoke w/ Pt- informed of recommendations- she states that its the other way around, per her pharmacist she was told to stop using triple antibiotic cream and to call her doctor for silvadene cream. When I explained again the rationale behind why Dr. Carmelia Roller did not recommend it, she hung up on me.

## 2022-10-10 NOTE — Telephone Encounter (Signed)
We don't really use that for burns anymore due to slowing the healing process, more pain and more dressing changes associated with it. If there is a lot of pain, aloe vera can be used. If she is worried about infection, we can consider triple antibiotic ointment (Neosporin). Ty.

## 2022-10-16 ENCOUNTER — Telehealth: Payer: Self-pay | Admitting: Internal Medicine

## 2022-10-16 ENCOUNTER — Encounter: Payer: Self-pay | Admitting: Internal Medicine

## 2022-10-16 DIAGNOSIS — K22719 Barrett's esophagus with dysplasia, unspecified: Secondary | ICD-10-CM

## 2022-10-16 DIAGNOSIS — K2270 Barrett's esophagus without dysplasia: Secondary | ICD-10-CM | POA: Insufficient documentation

## 2022-10-16 NOTE — Telephone Encounter (Signed)
Extensive GI notes reviewed.  To be scanned. Ultrasound abdomen - pelvis 03/28/2022: Cholelithiasis- Leiomyomas.  EGD 05/27/2022: Biopsy showed Barrett's and H. pylori.  Treated with PPIs and antibiotics. Next EGD 1 year H. pylori breathing test 08/21/2022: Negative  Colonoscopy 08/27/2022: No polyps.  10 years

## 2022-10-23 ENCOUNTER — Other Ambulatory Visit: Payer: Self-pay | Admitting: Obstetrics and Gynecology

## 2022-10-23 ENCOUNTER — Encounter: Payer: Self-pay | Admitting: Obstetrics and Gynecology

## 2022-10-23 DIAGNOSIS — N63 Unspecified lump in unspecified breast: Secondary | ICD-10-CM

## 2022-10-24 ENCOUNTER — Other Ambulatory Visit: Payer: Self-pay | Admitting: Obstetrics and Gynecology

## 2022-10-24 DIAGNOSIS — R921 Mammographic calcification found on diagnostic imaging of breast: Secondary | ICD-10-CM

## 2022-10-25 ENCOUNTER — Other Ambulatory Visit: Payer: BC Managed Care – PPO

## 2022-10-25 ENCOUNTER — Other Ambulatory Visit: Payer: Self-pay | Admitting: Obstetrics and Gynecology

## 2022-10-25 DIAGNOSIS — R921 Mammographic calcification found on diagnostic imaging of breast: Secondary | ICD-10-CM

## 2022-11-26 ENCOUNTER — Other Ambulatory Visit: Payer: Self-pay | Admitting: Obstetrics and Gynecology

## 2022-11-26 ENCOUNTER — Ambulatory Visit
Admission: RE | Admit: 2022-11-26 | Discharge: 2022-11-26 | Disposition: A | Discharge status: Home / Self Care | Payer: BC Managed Care – PPO | Source: Ambulatory Visit | Attending: Obstetrics and Gynecology | Admitting: Obstetrics and Gynecology

## 2022-11-26 ENCOUNTER — Other Ambulatory Visit: Payer: BC Managed Care – PPO

## 2022-11-26 DIAGNOSIS — R921 Mammographic calcification found on diagnostic imaging of breast: Secondary | ICD-10-CM

## 2022-11-27 DIAGNOSIS — R1031 Right lower quadrant pain: Secondary | ICD-10-CM | POA: Diagnosis not present

## 2022-11-27 DIAGNOSIS — D259 Leiomyoma of uterus, unspecified: Secondary | ICD-10-CM | POA: Diagnosis not present

## 2022-12-18 ENCOUNTER — Encounter: Payer: Self-pay | Admitting: Internal Medicine

## 2022-12-26 LAB — HM DIABETES EYE EXAM

## 2022-12-30 DIAGNOSIS — N390 Urinary tract infection, site not specified: Secondary | ICD-10-CM | POA: Diagnosis not present

## 2022-12-30 DIAGNOSIS — R8290 Unspecified abnormal findings in urine: Secondary | ICD-10-CM | POA: Diagnosis not present

## 2023-04-21 ENCOUNTER — Encounter: Payer: Self-pay | Admitting: Internal Medicine

## 2023-04-21 ENCOUNTER — Ambulatory Visit (INDEPENDENT_AMBULATORY_CARE_PROVIDER_SITE_OTHER): Payer: BC Managed Care – PPO | Admitting: Internal Medicine

## 2023-04-21 VITALS — BP 116/76 | HR 72 | Temp 98.0°F | Resp 16 | Ht 67.0 in | Wt 190.0 lb

## 2023-04-21 DIAGNOSIS — E119 Type 2 diabetes mellitus without complications: Secondary | ICD-10-CM

## 2023-04-21 DIAGNOSIS — Z0000 Encounter for general adult medical examination without abnormal findings: Secondary | ICD-10-CM | POA: Diagnosis not present

## 2023-04-21 DIAGNOSIS — Z0001 Encounter for general adult medical examination with abnormal findings: Secondary | ICD-10-CM

## 2023-04-21 DIAGNOSIS — M2170 Unequal limb length (acquired), unspecified site: Secondary | ICD-10-CM | POA: Diagnosis not present

## 2023-04-21 DIAGNOSIS — G4700 Insomnia, unspecified: Secondary | ICD-10-CM

## 2023-04-21 LAB — LIPID PANEL
Cholesterol: 140 mg/dL (ref 0–200)
HDL: 40.9 mg/dL (ref >39.00)
LDL Cholesterol: 90 mg/dL (ref 0–99)
NonHDL: 98.93
Total CHOL/HDL Ratio: 3
Triglycerides: 44 mg/dL (ref 0.0–149.0)
VLDL: 8.8 mg/dL (ref 0.0–40.0)

## 2023-04-21 LAB — CBC WITH DIFFERENTIAL/PLATELET
Basophils Absolute: 0 10*3/uL (ref 0.0–0.1)
Basophils Relative: 0.5 % (ref 0.0–3.0)
Eosinophils Absolute: 0.1 10*3/uL (ref 0.0–0.7)
Eosinophils Relative: 2.5 % (ref 0.0–5.0)
HCT: 37.7 % (ref 36.0–46.0)
Hemoglobin: 12.4 g/dL (ref 12.0–15.0)
Lymphocytes Relative: 35.4 % (ref 12.0–46.0)
Lymphs Abs: 1.6 10*3/uL (ref 0.7–4.0)
MCHC: 32.8 g/dL (ref 30.0–36.0)
MCV: 93.3 fl (ref 78.0–100.0)
Monocytes Absolute: 0.3 10*3/uL (ref 0.1–1.0)
Monocytes Relative: 7.4 % (ref 3.0–12.0)
Neutro Abs: 2.4 10*3/uL (ref 1.4–7.7)
Neutrophils Relative %: 54.2 % (ref 43.0–77.0)
Platelets: 185 10*3/uL (ref 150.0–400.0)
RBC: 4.04 Mil/uL (ref 3.87–5.11)
RDW: 14.7 % (ref 11.5–15.5)
WBC: 4.4 10*3/uL (ref 4.0–10.5)

## 2023-04-21 LAB — COMPREHENSIVE METABOLIC PANEL
ALT: 14 U/L (ref 0–35)
AST: 17 U/L (ref 0–37)
Albumin: 4 g/dL (ref 3.5–5.2)
Alkaline Phosphatase: 61 U/L (ref 39–117)
BUN: 10 mg/dL (ref 6–23)
CO2: 28 meq/L (ref 19–32)
Calcium: 9.1 mg/dL (ref 8.4–10.5)
Chloride: 107 meq/L (ref 96–112)
Creatinine, Ser: 0.72 mg/dL (ref 0.40–1.20)
GFR: 101.27 mL/min (ref >60.00)
Glucose, Bld: 106 mg/dL — ABNORMAL HIGH (ref 70–99)
Potassium: 4.1 meq/L (ref 3.5–5.1)
Sodium: 139 meq/L (ref 135–145)
Total Bilirubin: 0.5 mg/dL (ref 0.2–1.2)
Total Protein: 7.1 g/dL (ref 6.0–8.3)

## 2023-04-21 LAB — HEMOGLOBIN A1C: Hgb A1c MFr Bld: 5.5 % (ref 4.6–6.5)

## 2023-04-21 LAB — MICROALBUMIN / CREATININE URINE RATIO
Creatinine,U: 160.5 mg/dL
Microalb Creat Ratio: 4.9 mg/g (ref 0.0–30.0)
Microalb, Ur: 0.8 mg/dL (ref 0.0–1.9)

## 2023-04-21 MED ORDER — HYDROXYZINE HCL 25 MG PO TABS: 25.0000 mg | ORAL_TABLET | Freq: Every evening | ORAL | 3 refills | Status: DC | PRN

## 2023-04-21 NOTE — Patient Instructions (Addendum)
 Vaccines I recommend: Flu shot every fall, a COVID booster if not done in the last 6 months.   GO TO THE LAB : Get the blood work     Please go to the front desk: Arrange a follow-up in 4 months    STOP BY THE FIRST FLOOR:  get the XR    If you have MyChart, please check frequently in the next few days for your results    Per our records you are due for your diabetic eye exam. Please contact your eye doctor to schedule an appointment. Please have them send copies of your office visit notes to [place]. Our fax number is [phone]. If you need a referral to an eye doctor please let us know.

## 2023-04-21 NOTE — Progress Notes (Signed)
 Subjective:    Patient ID: [name] [name], female    DOB: Sep 15, 1980, 43 y.o.   MRN: [id number]  DOS:  04/21/2023 Type of visit - description: CPX  Here for CPX. Last A1c was at the diabetic level. She has changed her diet is more active, + weight loss. Feels well.  Wt Readings from Last 3 Encounters:  04/21/23 190 lb (86.2 kg)  10/04/22 201 lb (91.2 kg)  08/09/22 220 lb (99.8 kg)   Review of Systems  Other than above, a 14 point review of systems is negative       Past Medical History:  Diagnosis Date   AMA (advanced maternal age) multigravida 35+    Cholelithiases 11/2013   elevated LFTs   Gestational diabetes    Multiple allergies    chronic sneezing   Sickle cell trait (HCC)    Uterine fibroid    Vaginal Pap smear, abnormal     Past Surgical History:  Procedure Laterality Date   CESAREAN SECTION WITH BILATERAL TUBAL LIGATION Bilateral 05/29/2013   Procedure: CESAREAN SECTION WITH BILATERAL TUBAL LIGATION;  Surgeon: Turner Daniels, MD;  Location: [facility];  Service: Obstetrics;  Laterality: Bilateral;  PRIMARY EDC 4/30   leg discrepancy     LEG SURGERY     --R--from absecss with reconstructive flap- 2004   Social History   Socioeconomic History   Marital status: Married    Spouse name: Not on file   Number of children: 4   Years of education: Not on file   Highest education level: Not on file  Occupational History   Occupation: Best boy  Tobacco Use   Smoking status: Never   Smokeless tobacco: Never  Substance and Sexual Activity   Alcohol use: No   Drug use: No   Sexual activity: Not on file  Other Topics Concern   Not on file  Social History Narrative   Original from [place]    Household- pt, husband , 4 children at home    Social Drivers of Corporate investment banker Strain: Not on file  Food Insecurity: Not on file  Transportation Needs: Not on file  Physical Activity: Not on file  Stress: Not on file  Social Connections: Not on  file  Intimate Partner Violence: Not on file     Current Outpatient Medications  Medication Instructions   hydrOXYzine (ATARAX) 25 mg, Oral, At bedtime PRN       Objective:   Physical Exam BP 116/76   Pulse 72   Temp 98 F (36.7 C) (Oral)   Resp 16   Ht 5\' 7"  (1.702 m)   Wt 190 lb (86.2 kg)   LMP 03/28/2023 (Approximate)   SpO2 99%   BMI 29.76 kg/m  General: Well developed, NAD, BMI noted Neck: No  thyromegaly  HEENT:  Normocephalic . Face symmetric, atraumatic Lungs:  CTA B Normal respiratory effort, no intercostal retractions, no accessory muscle use. Heart: RRR,  no murmur.  Abdomen:  Not distended, soft, non-tender. No rebound or rigidity.   Lower extremities: no pretibial edema bilaterally  Skin: Exposed areas without rash. Not pale. Not jaundice Neurologic:  alert & oriented X3.  Speech normal, gait appropriate for age and unassisted Strength symmetric and appropriate for age.  Psych: Cognition and judgment appear intact.  Cooperative with normal attention span and concentration.  Behavior appropriate. No anxious or depressed appearing.     Assessment     Assessment H/o Gestational diabetes Allergies Sickle cell trait  2015: abd pain --> elevated LFTs --> US GB stones --> MRCP nl biliar tree, + GB stones , saw GI 11-2013, surgical referral failed; saw GI 2017, then saw surgery 11-2015, HIDA (-), no surgery  BTL Workup per GI: ---03-2022: US abdomen cholelithiasis, leiomyomas ---05/27/2022: EGD 05/27/2022: Bx  Barrett's and H. pylori.RX abx,antibiotics.Next EGD 1 year H. pylori breathing test 08/21/2022: Negative ---08/27/2022: Negative colonoscopy, 10 years  PLAN: Here for CPX -Td 04/2022 - vax I recommend:  covid, flu shot q fall  -Female care: Per gyn, had a MMG 05/2022 ( [facility])   -CCS- Colonoscopy 08/27/2022: No polyps.  10 years  -Diet and exercise reportedly doing well.  See comments under obesity. -Labs:CMP FLP CBC A1c micro Also discussed the  following: DM: A1c a year ago was 6.6, did very positive changes on her lifestyle, + weight loss, check A1c, FLP and micro.  Further advised for results. Leg length discrepancy: Needs R shoe insert.  Prescription printed.  Get supply from the [facility]. Lower abdominal pain: See LOV, CT was no acute Insomnia: Also at the end of the visit she reported difficulty sleeping, request treatment.  Trial with Atarax, Rx sent. RTC 4 months

## 2023-04-21 NOTE — Assessment & Plan Note (Signed)
 Here for CPX -Td 04/2022 - vax I recommend:  covid, flu shot q fall  -Female care: Per gyn, had a MMG 05/2022 ( [facility])   -CCS- Colonoscopy 08/27/2022: No polyps.  10 years  -Diet and exercise reportedly doing well.  See comments under obesity. -Labs:CMP FLP CBC A1c micro

## 2023-04-21 NOTE — Assessment & Plan Note (Signed)
 Here for CPX Also discussed the following: DM: A1c a year ago was 6.6, did very positive changes on her lifestyle, + weight loss, check A1c, FLP and micro.  Further advised for results. Leg length discrepancy: Needs R shoe insert.  Prescription printed.  Get supply from the [facility]. Lower abdominal pain: See LOV, CT was no acute Insomnia: Also at the end of the visit she reported difficulty sleeping, request treatment.  Trial with Atarax, Rx sent. RTC 4 months

## 2023-05-13 ENCOUNTER — Ambulatory Visit: Payer: Self-pay

## 2023-05-13 NOTE — Telephone Encounter (Signed)
 Patient called in stating she is traveling to [place] this weekend and would like anti-malaria medication called in before then, if possible. Patient would like it called in to the [facility] on file.   Copied from CRM [id number]. Topic: Clinical - Medication Question >> May 13, 2023  5:24 PM Danika B wrote: Reason for CRM: Patient states that she needs a medication, but does not know the name of it. States that it is an "anti-malaria" medication that is recommended before traveling. States that she needs it today, but patient informed of 48-72 hour turnaround time for prescriptions. Callback #[phone] Answer Assessment - Initial Assessment Questions 1. REASON FOR CALL or QUESTION: "What is your reason for calling today?" or "How can I best help you?" or "What question do you have that I can help answer?"     Please see notes 2. CALLER: Document the source of call. (e.g., laboratory, patient).     Patient  Protocols used: PCP Call - No Triage-A-AH

## 2023-05-14 MED ORDER — HYDROXYZINE HCL 25 MG PO TABS: 25.0000 mg | ORAL_TABLET | Freq: Every evening | ORAL | 3 refills | Status: DC | PRN

## 2023-05-14 MED ORDER — DOXYCYCLINE HYCLATE 100 MG PO TABS: 100.0000 mg | ORAL_TABLET | Freq: Two times a day (BID) | ORAL | 0 refills | Status: DC

## 2023-05-14 NOTE — Telephone Encounter (Signed)
 Advise patient. I can send doxycycline for prophylaxis, she has to be 100% certain she is not pregnant, UPT if needed. If she is okay with that, send  doxycycline 100 mg daily. Start 1 or 2 days prior to travel, take during the travel and 4 weeks after returning. Send #60, no refills.

## 2023-05-14 NOTE — Addendum Note (Signed)
 Addended byConrad Tamarack D on: 05/14/2023 09:55 AM   Modules accepted: Orders

## 2023-05-14 NOTE — Telephone Encounter (Signed)
 Spoke w/ Pt- informed of recommendations. She asked for malarone- informed PCP would require her to see travel clinic for that. She then asked if her husband can get a prescription for doxycyline as well, informed he will need to contact his provider. Pt asked that doxycycline be sent to [facility]. Rx printed and faxed to [facility].

## 2023-05-30 ENCOUNTER — Ambulatory Visit
Admission: RE | Admit: 2023-05-30 | Discharge: 2023-05-30 | Disposition: A | Discharge status: Home / Self Care | Source: Ambulatory Visit | Attending: Obstetrics and Gynecology | Admitting: Obstetrics and Gynecology

## 2023-05-30 DIAGNOSIS — R921 Mammographic calcification found on diagnostic imaging of breast: Secondary | ICD-10-CM

## 2023-05-30 DIAGNOSIS — R920 Mammographic microcalcification found on diagnostic imaging of breast: Secondary | ICD-10-CM | POA: Diagnosis not present

## 2023-06-11 DIAGNOSIS — D251 Intramural leiomyoma of uterus: Secondary | ICD-10-CM | POA: Diagnosis not present

## 2023-06-11 DIAGNOSIS — D252 Subserosal leiomyoma of uterus: Secondary | ICD-10-CM | POA: Diagnosis not present

## 2023-06-11 DIAGNOSIS — Z01419 Encounter for gynecological examination (general) (routine) without abnormal findings: Secondary | ICD-10-CM | POA: Diagnosis not present

## 2023-06-11 DIAGNOSIS — Z124 Encounter for screening for malignant neoplasm of cervix: Secondary | ICD-10-CM | POA: Diagnosis not present

## 2023-06-11 DIAGNOSIS — Z6831 Body mass index (BMI) 31.0-31.9, adult: Secondary | ICD-10-CM | POA: Diagnosis not present

## 2023-06-11 LAB — HM PAP SMEAR

## 2023-07-15 ENCOUNTER — Encounter: Payer: Self-pay | Admitting: Internal Medicine

## 2023-07-15 ENCOUNTER — Ambulatory Visit (INDEPENDENT_AMBULATORY_CARE_PROVIDER_SITE_OTHER): Admitting: Internal Medicine

## 2023-07-15 VITALS — BP 106/60 | HR 98 | Temp 98.0°F | Resp 16 | Ht 67.0 in | Wt 202.4 lb

## 2023-07-15 DIAGNOSIS — F419 Anxiety disorder, unspecified: Secondary | ICD-10-CM | POA: Diagnosis not present

## 2023-07-15 DIAGNOSIS — F4320 Adjustment disorder, unspecified: Secondary | ICD-10-CM

## 2023-07-15 NOTE — Patient Instructions (Signed)
 We are completing your paperwork.  Please see the counselor ASAP  Come back for a checkup in 4 weeks.  Reach out anytime if your symptoms get worse

## 2023-07-15 NOTE — Progress Notes (Unsigned)
 Subjective:    Patient ID: [name] [name], female    DOB: Mar 13, 1980, 43 y.o.   MRN: [id number]  DOS:  07/15/2023 Type of visit - description: Acute, anxiety, stress.  Lost her brother October 2024 due to sepsis Lost his father due to stroke December 2024.  Went to [place] for a  funeral in April 2025.  For the last few months, she has been very sad and anxious.  She has been trying to be strong but at this point she is constantly thinking about her losses and is affecting her job performance.  Unable to concentrate.  Frequently tearful.  She has not been able to take time off to grieve properly because her demanding job but at this point she thinks needs to take off temporarily.   Review of Systems See above   Past Medical History:  Diagnosis Date   AMA (advanced maternal age) multigravida 35+    Cholelithiases 11/2013   elevated LFTs   Gestational diabetes    Multiple allergies    chronic sneezing   Sickle cell trait (HCC)    Uterine fibroid    Vaginal Pap smear, abnormal     Past Surgical History:  Procedure Laterality Date   CESAREAN SECTION WITH BILATERAL TUBAL LIGATION Bilateral 05/29/2013   Procedure: CESAREAN SECTION WITH BILATERAL TUBAL LIGATION;  Surgeon: Denette Finner, MD;  Location: [facility];  Service: Obstetrics;  Laterality: Bilateral;  PRIMARY EDC 4/30   leg discrepancy     LEG SURGERY     --R--from absecss with reconstructive flap- 2004    Current Outpatient Medications  Medication Instructions   doxycycline  (VIBRA -TABS) 100 mg, Oral, 2 times daily, Take for 4 weeks after you return   hydrOXYzine  (ATARAX ) 25 mg, Oral, At bedtime PRN       Objective:   Physical Exam BP 106/60   Pulse 98   Temp 98 F (36.7 C) (Oral)   Resp 16   Ht 5\' 7"  (1.702 m)   Wt 202 lb 6 oz (91.8 kg)   LMP 06/24/2023 (Approximate)   SpO2 98%   BMI 31.70 kg/m  General:   Well developed, NAD, BMI noted. HEENT:  Normocephalic . Face symmetric, atraumatic Lungs:  CTA  B Normal respiratory effort, no intercostal retractions, no accessory muscle use. Heart: RRR,  no murmur.  Lower extremities: no pretibial edema bilaterally  Skin: Not pale. Not jaundice Neurologic:  alert & oriented X3.  Speech normal, gait appropriate for age and unassisted Psych--  Tearful during part of the visit    Assessment   Assessment H/o Gestational diabetes Allergies Sickle cell trait 2015: abd pain --> elevated LFTs --> US  GB stones --> MRCP nl biliar tree, + GB stones , saw GI 11-2013, surgical referral failed; saw GI 2017, then saw surgery 11-2015, HIDA (-), no surgery  BTL Workup per GI: ---03-2022: US  abdomen cholelithiasis, leiomyomas ---05/27/2022: EGD 05/27/2022: Bx  Barrett's and H. pylori.RX abx,antibiotics.Next EGD 1 year H. pylori breathing test 08/21/2022: Negative ---08/27/2022: Negative colonoscopy, 10 years  PLAN: Grieving: Lost brother in October 2024 his father in December 2024. This has been very hard on her, she has been trying to be strong but at this point she cannot take it anymore, she is very sad, frequently tearful, unable to perform her job. So far has not taken time off but she thinks that is necessary, I agree. FMLA paperwork  completed today. Unable to work from 07/16/2023 to August 18 2023 Intermittently work absence over the  following few months due to anticipated ongoing need to psychotherapy weekly. Additional treatment includes see a counselor ASAP to help her to grieve properly.  Patient is already working on it. We talk about SSRIs, she is not ready for it. See AVS PHQ-9: 13 GAD 116 RTC 4 weeks

## 2023-07-16 NOTE — Assessment & Plan Note (Signed)
 Grieving: Lost brother in October 2024 his father in December 2024. This has been very hard on her, she has been trying to be strong but at this point she cannot take it anymore, she is very sad, frequently tearful, unable to perform her job. So far has not taken time off but she thinks that is necessary, I agree. FMLA paperwork  completed today. Unable to work from 07/16/2023 to August 18 2023 Intermittently work absence over the following few months due to anticipated ongoing need to psychotherapy weekly. Additional treatment includes see a counselor ASAP to help her to grieve properly.  Patient is already working on it. We talk about SSRIs, she is not ready for it. See AVS PHQ-9: 13 GAD 116 RTC 4 weeks

## 2023-07-21 ENCOUNTER — Ambulatory Visit: Admitting: Behavioral Health

## 2023-07-21 NOTE — Progress Notes (Unsigned)
 [facility] Counselor Initial Adult Exam  Name: [name] [name] Date: 07/21/2023 MRN: [id number] DOB: January 14, 1981 PCP: Ezell Hollow, MD  Time spent: ***  Guardian/Payee:  ***    Paperwork requested: {WUJ:81191}  Reason for Visit /Presenting Problem: ***  Mental Status Exam: Appearance:   {PSY:22683}     Behavior:  {PSY:21022743}  Motor:  {PSY:22302}  Speech/Language:   {PSY:22685}  Affect:  {PSY:22687}  Mood:  {PSY:31886}  Thought process:  {PSY:31888}  Thought content:    {[phone]}  Sensory/Perceptual disturbances:    {[phone]}  Orientation:  {PSY:30297}  Attention:  {PSY:22877}  Concentration:  {PSY:5626237129}  Memory:  {[phone]}  Fund of knowledge:   {PSY:5626237129}  Insight:    {PSY:5626237129}  Judgment:   {PSY:5626237129}  Impulse Control:  {PSY:5626237129}   Appearance: {PSY:22683}    Behavior: {PSY:21022743} Motor: {PSY:22302} Speech/Language: {PSY:22685} Affect: {PSY:22687} Mood: {PSY:31886} Thought process: {PSY:31888} Thought content: {[phone]} Sensory/Perceptual disturbances: {[phone]} Orientation: {PSY:30297} Attention: {PSY:22877} Concentration: {PSY:5626237129} Memory: {[phone]} Fund of knowledge: {PSY:5626237129} Insight: {PSY:5626237129} Judgment: {PSY:5626237129} Impulse Control: {PSY:5626237129}  Reported Symptoms:  ***  Risk Assessment: Danger to Self:  {PSY:22692} Self-injurious Behavior: {PSY:22692} Danger to Others: {PSY:22692} Duty to Warn:{PSY:311194} Physical Aggression / Violence:{PSY:21197} Access to Firearms a concern: {PSY:21197} Gang Involvement:{PSY:21197} Patient / guardian was educated about steps to take if suicide or homicide risk level increases between visits: {Yes/No-Ex:120004} While future psychiatric events cannot be accurately predicted, the patient does not currently require acute inpatient psychiatric care and does not currently meet [place]  involuntary  commitment criteria.  Substance Abuse History: Current substance abuse: {PSY:21197}    Past Psychiatric History:   {Past psych history:20559} Outpatient Providers:*** History of Psych Hospitalization: {PSY:21197} Psychological Testing: {PSY:21014032}   Abuse History:  Victim of: {Abuse History:314532}, {Type of abuse:20566}   Report needed: {PSY:314532} Victim of Neglect:{yes no:314532} Perpetrator of {PSY:20566}  Witness / Exposure to Domestic Violence: {PSY:21197}  Protective Services Involvement: {PSY:21197} Witness to [employer] Violence:  {PSY:21197}  Family History:  Family History  Problem Relation Age of Onset   Diabetes Maternal Grandmother    Coronary artery disease Neg Hx    Colon cancer Neg Hx    Breast cancer Neg Hx    Stroke Neg Hx     Living situation: the patient {lives:315711::"lives with their family"}  Sexual Orientation: {Sexual [phone]}  Relationship Status: {Desc; marital status:62}  Name of spouse / other:*** If a parent, number of children / ages:***  Support Systems: {DIABETES SUPPORT:20310}  Financial Stress:  {YES/NO:21197}  Income/Employment/Disability: Manufacturing engineer: [employer]  Educational History: Education: {PSY :31912}  Religion/Sprituality/World View: {CHL AMB RELIGION/SPIRITUALITY:[phone]}  Any cultural differences that may affect / interfere with treatment:  {Religious/Cultural:200019}  Recreation/Hobbies: {Woc hobbies:30428}  Stressors: {PATIENT STRESSORS:22669}  Strengths: {Patient Coping Strengths:[phone]}  Barriers:  ***   Legal History: Pending legal issue / charges: {PSY:20588} History of legal issue / charges: {Legal [phone]}  Medical History/Surgical History: {Desc; reviewed/not reviewed:60074} Past Medical History:  Diagnosis Date   AMA (advanced maternal age) multigravida 35+    Cholelithiases 11/2013   elevated LFTs   Gestational  diabetes    Multiple allergies    chronic sneezing   Sickle cell trait (HCC)    Uterine fibroid    Vaginal Pap smear, abnormal     Past Surgical History:  Procedure Laterality Date   CESAREAN SECTION WITH BILATERAL TUBAL LIGATION Bilateral 05/29/2013   Procedure: CESAREAN SECTION WITH BILATERAL TUBAL LIGATION;  Surgeon: Denette Finner, MD;  Location: [facility];  Service: Obstetrics;  Laterality: Bilateral;  PRIMARY EDC 4/30   leg discrepancy     LEG SURGERY     --R--from absecss with reconstructive flap- 2004    Medications: Current Outpatient Medications  Medication Sig Dispense Refill   doxycycline  (VIBRA -TABS) 100 MG tablet Take 1 tablet (100 mg total) by mouth 2 (two) times daily. Take for 4 weeks after you return (Patient not taking: Reported on 07/15/2023) 60 tablet 0   hydrOXYzine  (ATARAX ) 25 MG tablet Take 1 tablet (25 mg total) by mouth at bedtime as needed (difficulty  sleeping). (Patient not taking: Reported on 07/15/2023) 30 tablet 3   No current facility-administered medications for this visit.    Allergies  Allergen Reactions   Cephalexin     REACTION: Rash   Fluviral [Influenza Virus Vaccine] Other (See Comments)    unknown    Haemophilus B Polysaccharide Vaccine Other (See Comments)    unknown    Ivp Dye [Iodinated Contrast Media]     Diagnoses:  No diagnosis found.  Plan of Care: ***   Elroy Hallmark, LMFT

## 2023-07-21 NOTE — Progress Notes (Unsigned)
   Deneise Lever, LMFT

## 2023-07-23 DIAGNOSIS — A048 Other specified bacterial intestinal infections: Secondary | ICD-10-CM | POA: Diagnosis not present

## 2023-07-23 DIAGNOSIS — K2270 Barrett's esophagus without dysplasia: Secondary | ICD-10-CM | POA: Diagnosis not present

## 2023-07-25 ENCOUNTER — Telehealth: Payer: Self-pay

## 2023-07-25 DIAGNOSIS — Z0279 Encounter for issue of other medical certificate: Secondary | ICD-10-CM

## 2023-07-25 NOTE — Telephone Encounter (Signed)
 ST disability form completed and faxed back to [employer] at [phone]. Form sent for scanning.

## 2023-07-31 ENCOUNTER — Ambulatory Visit (INDEPENDENT_AMBULATORY_CARE_PROVIDER_SITE_OTHER): Payer: PRIVATE HEALTH INSURANCE | Admitting: Behavioral Health

## 2023-07-31 DIAGNOSIS — F4320 Adjustment disorder, unspecified: Secondary | ICD-10-CM

## 2023-07-31 DIAGNOSIS — Z566 Other physical and mental strain related to work: Secondary | ICD-10-CM

## 2023-07-31 DIAGNOSIS — F4321 Adjustment disorder with depressed mood: Secondary | ICD-10-CM | POA: Diagnosis not present

## 2023-07-31 NOTE — Progress Notes (Signed)
 [facility] Counselor Initial Adult Exam  Name: [name] [name] Date: 07/31/2023 MRN: [id number] DOB: 30-Mar-1980 PCP: [name] Hollow, MD  Time spent: 60 min [name] [name] video; Pt is home in private & Provider working remotely from [name]. Pt is aware of the risks/limitations of telehealth & consents to Tx today.  Time In: 4:00pm Time Out: 5:00pm Visit: 1  Guardian/Payee:  [name] [name]    Paperwork requested: Yes ; EAP 4 sessions authorized through 07/30/2024  Reason for Visit /Presenting Problem: Grief rxn due to death of Bros in 2022/12/11& Father's death in 02-10-23. 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  Mental Status Exam: Appearance:   Casual     Behavior:  Appropriate and Sharing  Motor:  Normal  Speech/Language:   Clear and Coherent  Affect:  Depressed  Mood:  sad  Thought process:  normal  Thought content:    WNL  Sensory/Perceptual disturbances:    WNL  Orientation:  oriented to person, place, time/date, and situation  Attention:  Good  Concentration:  Good  Memory:  WNL  Fund of knowledge:   Good  Insight:    Good  Judgment:   Good  Impulse Control:  Good    Risk Assessment: Danger to Self:  No Self-injurious Behavior: No Danger to Others: No Duty to Warn:no Physical Aggression / Violence:No  Access to Firearms a concern: No  Gang Involvement:No  Patient / guardian was educated about steps to take if suicide or homicide risk level increases between visits: Yes; appropriate to ICD process While future psychiatric events cannot be accurately predicted, the patient does not currently require acute inpatient psychiatric care and does not currently meet   involuntary commitment criteria.  Substance Abuse History: Current substance abuse: No     Past Psychiatric History:   No previous psychological problems have been observed Outpatient Providers: Dr. Devonna Foley, MD History of Psych Hospitalization:  No  Psychological Testing: NA   Abuse History:  Victim of: No., NA   Report needed: No. Victim of Neglect:No. Perpetrator of NA  Witness / Exposure to Domestic Violence: No   Protective Services Involvement: No  Witness to [employer] Violence:  No   Family History:  Family History  Problem Relation Age of Onset   Diabetes Maternal Grandmother    Coronary artery disease Neg Hx    Colon cancer Neg Hx    Breast cancer Neg Hx    Stroke Neg Hx     Living situation: the patient lives with their family  Sexual Orientation: Straight  Relationship Status: married  Name of spouse / other: [name] [name] If a parent, number of children / ages: eldest Dtr - [name] [name]  Support Systems: spouse Children [facility] @ [name] Medication doxycycline  100mg  helps  Financial Stress:  No   Income/Employment/Disability: 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  Military Service: No   Educational History: Education: [name] [name]; MBA  Religion/Sprituality/World View: Christianity; supportive Community @ [name] [name]  Any cultural differences that may affect / interfere with treatment:  None noted today  Recreation/Hobbies: music - lots of [name] [name], singing!  Stressors: Loss of eldest [name] [name] [phone]) who succumb to the insult of Sepsis which happened prior to repair of a preforated intestine in 12-11-2022& Father's death in Dec 30, 2024w/Funeral done in [place] in April 2025.    Strengths: Supportive Relationships,  Family, Friends, [name], Spirituality, [name] [name], newspaper, and a Designer, fashion/clothing @ work who checks on her routinely.  Barriers:  Language depth creates difficulties    Legal History: Pending legal issue / charges: The patient has no significant history of legal issues. History of legal issue / charges: NA  Medical History/Surgical History: reviewed Past Medical History:  Diagnosis Date    AMA (advanced maternal age) multigravida 35+    Cholelithiases 11/2013   elevated LFTs   Gestational diabetes    Multiple allergies    chronic sneezing   Sickle cell trait (HCC)    Uterine fibroid    Vaginal Pap smear, abnormal     Past Surgical History:  Procedure Laterality Date   CESAREAN SECTION WITH BILATERAL TUBAL LIGATION Bilateral 05/29/2013   Procedure: CESAREAN SECTION WITH BILATERAL TUBAL LIGATION;  Surgeon: [name] Finner, MD;  Location: [facility];  Service: Obstetrics;  Laterality: Bilateral;  PRIMARY EDC 4/30   leg discrepancy     LEG SURGERY     --R--from absecss with reconstructive flap- 2004    Medications: Current Outpatient Medications  Medication Sig Dispense Refill   doxycycline  (VIBRA -TABS) 100 MG tablet Take 1 tablet (100 mg total) by mouth 2 (two) times daily. Take for 4 weeks after you return (Patient not taking: Reported on 07/15/2023) 60 tablet 0   hydrOXYzine  (ATARAX ) 25 MG tablet Take 1 tablet (25 mg total) by mouth at bedtime as needed (difficulty  sleeping). (Patient not taking: Reported on 07/15/2023) 30 tablet 3   No current facility-administered medications for this visit.    Allergies  Allergen Reactions   Cephalexin     REACTION: Rash   Fluviral [Influenza Virus Vaccine] Other (See Comments)    unknown    Haemophilus B Polysaccharide Vaccine Other (See Comments)    unknown    Ivp Dye [Iodinated Contrast Media]     Diagnoses:  Grief reaction  Work-related stress  Plan of Care: [name] [name] will complete 4 EAP sessions over the next year to support her grieving needs. She has realized she needs the help of a Professional. Her African [employer] has provided meaningful support & she still wants more assistance. She will keep all scheduled appts & track her progress to report in sessions. She will implement the suggestions provided & provide feedback about effectiveness.   Target Date: 09/10/2023  Progress: 4  Frequency: Once every 3 wks & then  re-eval for use of [name] Ins  Modality: Deborrah Fam, LMFT

## 2023-07-31 NOTE — Progress Notes (Signed)
   Deneise Lever, LMFT

## 2023-08-22 ENCOUNTER — Ambulatory Visit: Admitting: Behavioral Health

## 2023-08-22 ENCOUNTER — Ambulatory Visit: Admitting: Internal Medicine

## 2023-08-22 ENCOUNTER — Encounter: Payer: Self-pay | Admitting: Internal Medicine

## 2023-08-22 DIAGNOSIS — Z1211 Encounter for screening for malignant neoplasm of colon: Secondary | ICD-10-CM | POA: Diagnosis not present

## 2023-08-22 DIAGNOSIS — K2270 Barrett's esophagus without dysplasia: Secondary | ICD-10-CM | POA: Diagnosis not present

## 2023-09-05 ENCOUNTER — Ambulatory Visit: Admitting: Behavioral Health

## 2023-09-19 ENCOUNTER — Ambulatory Visit: Payer: Self-pay | Admitting: *Deleted

## 2023-09-19 ENCOUNTER — Ambulatory Visit (INDEPENDENT_AMBULATORY_CARE_PROVIDER_SITE_OTHER): Payer: PRIVATE HEALTH INSURANCE | Admitting: Behavioral Health

## 2023-09-19 ENCOUNTER — Ambulatory Visit: Payer: Self-pay

## 2023-09-19 DIAGNOSIS — Z566 Other physical and mental strain related to work: Secondary | ICD-10-CM | POA: Diagnosis not present

## 2023-09-19 DIAGNOSIS — F4320 Adjustment disorder, unspecified: Secondary | ICD-10-CM

## 2023-09-19 NOTE — Addendum Note (Signed)
 Addended by: Hadeel Hillebrand L on: 09/19/2023 01:05 PM   Modules accepted: Level of Service

## 2023-09-19 NOTE — Telephone Encounter (Signed)
 Nicholaus Sonny LABOR, RN    09/19/23  1:10 PM Note FYI Only or Action Required?: Action required by provider: update on patient condition.   Patient was last seen in primary care on 07/15/2023 by Amon Aloysius BRAVO, MD.   Called Nurse Triage reporting Abdominal Pain.   Symptoms began several weeks ago.   Interventions attempted: Nothing Symptoms are: unchanged. Pt. Given message from PCP. States she will call GI and GYN doctors.   Triage Disposition: See Physician Within 24 Hours   Patient/caregiver understands and will follow disposition?: No, wishes to speak with PCP   Copied from CRM #[id number]. Topic: Clinical - Red Word Triage >> Sep 19, 2023  1:06 PM Jayma L wrote: Red Word that prompted transfer to Nurse Triage:    Patient missed a call from nurse triage to get her scheduled , here is notes from nt :  FYI Only or Action Required?: FYI only for provider.   Patient was last seen in primary care on 07/15/2023 by Amon Aloysius BRAVO, MD.   Called Nurse Triage reporting Abdominal Pain.   Symptoms began several days ago.   Interventions attempted: OTC medications: ibuprofen  .   Symptoms are: unchanged.   Triage Disposition: See Physician Within 24 Hours   Patient/caregiver understands and will follow disposition?: Yes Reason for Disposition  [1] MODERATE pain (e.g., interferes with normal activities) AND [2] pain comes and goes (cramps) AND [3] present > 24 hours  (Exception: Pain with Vomiting or Diarrhea - see that Guideline.)  Answer Assessment - Initial Assessment Questions 1. LOCATION: Where does it hurt?      lower 2. RADIATION: Does the pain shoot anywhere else? (e.g., chest, back)     no 3. ONSET: When did the pain begin? (e.g., minutes, hours or days ago)      Several months 4. SUDDEN: Gradual or sudden onset?     gradual 5. PATTERN Does the pain come and go, or is it constant?     Comes and goes 6. SEVERITY: How bad is the pain?  (e.g., Scale 1-10; mild, moderate, or  severe)     moderate 7. RECURRENT SYMPTOM: Have you ever had this type of stomach pain before? If Yes, ask: When was the last time? and What happened that time?      no 8. CAUSE: What do you think is causing the stomach pain? (e.g., gallstones, recent abdominal surgery)     unsure 9. RELIEVING/AGGRAVATING FACTORS: What makes it better or worse? (e.g., antacids, bending or twisting motion, bowel movement)     no 10. OTHER SYMPTOMS: Do you have any other symptoms? (e.g., back pain, diarrhea, fever, urination pain, vomiting)       no 11. PREGNANCY: Is there any chance you are pregnant? When was your last menstrual period?       no  Protocols used: Abdominal Pain - [facility]

## 2023-09-19 NOTE — Telephone Encounter (Signed)
 Noted

## 2023-09-19 NOTE — Telephone Encounter (Signed)
 Per note below- Pt will contact GI and GYN

## 2023-09-19 NOTE — Telephone Encounter (Deleted)
 Nicholaus Sonny LABOR, RN    09/19/23  1:10 PM Note FYI Only or Action Required?: Action required by provider: update on patient condition.   Patient was last seen in primary care on 07/15/2023 by Amon Aloysius BRAVO, MD.   Called Nurse Triage reporting Abdominal Pain.   Symptoms began several weeks ago.   Interventions attempted: Nothing Symptoms are: unchanged. Pt. Given message from PCP. States she will call GI and GYN doctors.   Triage Disposition: See Physician Within 24 Hours   Patient/caregiver understands and will follow disposition?: No, wishes to speak with PCP   Copied from CRM #[id number]. Topic: Clinical - Red Word Triage >> Sep 19, 2023  1:06 PM Jayma L wrote: Red Word that prompted transfer to Nurse Triage:    Patient missed a call from nurse triage to get her scheduled , here is notes from nt :  FYI Only or Action Required?: FYI only for provider.   Patient was last seen in primary care on 07/15/2023 by Amon Aloysius BRAVO, MD.   Called Nurse Triage reporting Abdominal Pain.   Symptoms began several days ago.   Interventions attempted: OTC medications: ibuprofen  .   Symptoms are: unchanged.   Triage Disposition: See Physician Within 24 Hours   Patient/caregiver understands and will follow disposition?: Yes Reason for Disposition  [1] MODERATE pain (e.g., interferes with normal activities) AND [2] pain comes and goes (cramps) AND [3] present > 24 hours  (Exception: Pain with Vomiting or Diarrhea - see that Guideline.)  Answer Assessment - Initial Assessment Questions 1. LOCATION: Where does it hurt?      lower 2. RADIATION: Does the pain shoot anywhere else? (e.g., chest, back)     no 3. ONSET: When did the pain begin? (e.g., minutes, hours or days ago)      Several months 4. SUDDEN: Gradual or sudden onset?     gradual 5. PATTERN Does the pain come and go, or is it constant?     Comes and goes 6. SEVERITY: How bad is the pain?  (e.g., Scale 1-10; mild, moderate, or  severe)     moderate 7. RECURRENT SYMPTOM: Have you ever had this type of stomach pain before? If Yes, ask: When was the last time? and What happened that time?      no 8. CAUSE: What do you think is causing the stomach pain? (e.g., gallstones, recent abdominal surgery)     unsure 9. RELIEVING/AGGRAVATING FACTORS: What makes it better or worse? (e.g., antacids, bending or twisting motion, bowel movement)     no 10. OTHER SYMPTOMS: Do you have any other symptoms? (e.g., back pain, diarrhea, fever, urination pain, vomiting)       no 11. PREGNANCY: Is there any chance you are pregnant? When was your last menstrual period?       no  Protocols used: Abdominal Pain - [facility]

## 2023-09-19 NOTE — Progress Notes (Signed)
 [facility] Counselor/Therapist Progress Note  Patient ID: Crystalann Korf, MRN: [id number],    Date: 09/19/2023  Time Spent: 55 min Caregility video; Pt is home in private & Provider is working remotely from [employer]. Pt is aware of the risks/limitations of telehealth & consents to Tx today. Time In: 12:00pm Time Out: 12:55pm  Treatment Type: Individual Therapy  Reported Symptoms: Elevated anx/dep & grief due to loss & inc'd pressure @ work  Mental Status Exam: Appearance:  Casual     Behavior: Appropriate and Sharing  Motor: Normal  Speech/Language:  Clear and Coherent  Affect: Appropriate  Mood: anxious, irritable, and sad  Thought process: normal  Thought content:   WNL  Sensory/Perceptual disturbances:   WNL  Orientation: oriented to person, place, time/date, and situation  Attention: Good  Concentration: Good  Memory: WNL  Fund of knowledge:  Good  Insight:   Fair  Judgment:  Good  Impulse Control: Fair   Risk Assessment: Danger to Self:  No Self-injurious Behavior: No Danger to Others: No Duty to Warn:no Physical Aggression / Violence:No  Access to Firearms a concern: No  Gang Involvement:No   Subjective: Pt is stressed due to the one year Anniversary of her Bros's death.  Pt is stressed @ work & dreads the month-end deadlines.   Interventions: Grief Therapy, Psycho-education/Bibliotherapy, and Insight-Oriented  Diagnosis:Grief reaction  Work-related stress  Plan: Leba will write a Letter to her Brother. She will write anything she needs to express to him & also things to help her process his life.  Target Date: 10/11/2023  Progress: 6  Frequency: Once every 2-3 wks  Modality: Kennis Richerd LITTIE Hollace, LMFT

## 2023-09-19 NOTE — Telephone Encounter (Signed)
 FYI Only or Action Required?: Action required by provider: update on patient condition.  Patient was last seen in primary care on 07/15/2023 by [name] Aloysius BRAVO, MD.  Called Nurse Triage reporting Abdominal Pain.  Symptoms began several weeks ago.  Interventions attempted: Nothing Symptoms are: unchanged. Pt. Given message from PCP. States she will call GI and GYN doctors.  Triage Disposition: See Physician Within 24 Hours  Patient/caregiver understands and will follow disposition?: No, wishes to speak with PCP  Copied from CRM #[id number]. Topic: Clinical - Red Word Triage >> Sep 19, 2023  1:06 PM Jayma L wrote: Red Word that prompted transfer to Nurse Triage:   Patient missed a call from nurse triage to get her scheduled , here is notes from nt :  FYI Only or Action Required?: FYI only for provider.   Patient was last seen in primary care on 07/15/2023 by [name] Aloysius BRAVO, MD.   Called Nurse Triage reporting Abdominal Pain.   Symptoms began several days ago.   Interventions attempted: OTC medications: ibuprofen  .   Symptoms are: unchanged.   Triage Disposition: See Physician Within 24 Hours   Patient/caregiver understands and will follow disposition?: Yes Reason for Disposition  [1] MODERATE pain (e.g., interferes with normal activities) AND [2] pain comes and goes (cramps) AND [3] present > 24 hours  (Exception: Pain with Vomiting or Diarrhea - see that Guideline.)  Answer Assessment - Initial Assessment Questions 1. LOCATION: Where does it hurt?      lower 2. RADIATION: Does the pain shoot anywhere else? (e.g., chest, back)     no 3. ONSET: When did the pain begin? (e.g., minutes, hours or days ago)      Several months 4. SUDDEN: Gradual or sudden onset?     gradual 5. PATTERN Does the pain come and go, or is it constant?     Comes and goes 6. SEVERITY: How bad is the pain?  (e.g., Scale 1-10; mild, moderate, or severe)     moderate 7. RECURRENT SYMPTOM: Have you  ever had this type of stomach pain before? If Yes, ask: When was the last time? and What happened that time?      no 8. CAUSE: What do you think is causing the stomach pain? (e.g., gallstones, recent abdominal surgery)     unsure 9. RELIEVING/AGGRAVATING FACTORS: What makes it better or worse? (e.g., antacids, bending or twisting motion, bowel movement)     no 10. OTHER SYMPTOMS: Do you have any other symptoms? (e.g., back pain, diarrhea, fever, urination pain, vomiting)       no 11. PREGNANCY: Is there any chance you are pregnant? When was your last menstrual period?       no  Protocols used: Abdominal Pain - [facility]

## 2023-09-19 NOTE — Telephone Encounter (Signed)
 Patient seen with abdominal pain 08/09/2022. Previously, she saw GI. Ultrasound showed cholelithiasis, had a a  EGD (was  due to repeat 05/2023) and a colonoscopy with no polyps.  If she is referring to the same pain , on and off since then, recommend to see GI. If has a new or different pain, needs to be seen sooner perhaps by another provider  since my schedule is full.

## 2023-09-19 NOTE — Telephone Encounter (Signed)
 LMOM asking for call back. Okay for E2C2 to discuss.

## 2023-09-19 NOTE — Telephone Encounter (Signed)
 Okay to wait until 10/03/23 to see you?

## 2023-09-19 NOTE — Telephone Encounter (Signed)
  FYI Only or Action Required?: FYI only for provider.  Patient was last seen in primary care on 07/15/2023 by Amon Aloysius BRAVO, MD.  Called Nurse Triage reporting Abdominal Pain.  Symptoms began several days ago.  Interventions attempted: OTC medications: ibuprofen  .  Symptoms are: unchanged.  Triage Disposition: See Physician Within 24 Hours  Patient/caregiver understands and will follow disposition?: Yes                Copied from CRM #[id number]. Topic: Clinical - Red Word Triage >> Sep 19, 2023 10:27 AM Viola F wrote: Patient having lower abdominal pain - requested an appt for today Reason for Disposition  [1] MODERATE pain (e.g., interferes with normal activities) AND [2] pain comes and goes (cramps) AND [3] present > 24 hours  (Exception: Pain with Vomiting or Diarrhea - see that Guideline.)  Answer Assessment - Initial Assessment Questions Requesting appt to f/u on abdominal pain. Requesting a clear direction, what to do next' for management or treatment of pain that keeps coming and going. Which testing should be completed to figure out a diagnosis and how to treat so that pain does not continue to return and patient to keep taking medication. Pain managed with ibuprofen  and when stops medication pain returns.  Patient having difficulty working out in gym due to abdominal discomfort .Appt scheduled with PCP for 10/03/23.   Reviewed CT results message from PCP Dr. Amon from 08/12/22 . Please advise      1. LOCATION: Where does it hurt?      Right lower abdomen  2. RADIATION: Does the pain shoot anywhere else? (e.g., chest, back)     Through to the back  3. ONSET: When did the pain begin? (e.g., minutes, hours or days ago)      2 days ago 4. SUDDEN: Gradual or sudden onset?     When working out in gym 5. PATTERN Does the pain come and go, or is it constant?     Constant x 2 days but comes and goes over past year 6. SEVERITY: How bad is the pain?  (e.g.,  Scale 1-10; mild, moderate, or severe)     Moderate but not able to work out in gym  7. RECURRENT SYMPTOM: Have you ever had this type of stomach pain before? If Yes, ask: When was the last time? and What happened that time?      Yes  8. CAUSE: What do you think is causing the stomach pain? (e.g., gallstones, recent abdominal surgery)     Not sure hx fibroids, gallstones , hernia  9. RELIEVING/AGGRAVATING FACTORS: What makes it better or worse? (e.g., antacids, bending or twisting motion, bowel movement)     Ibuprofen   10. OTHER SYMPTOMS: Do you have any other symptoms? (e.g., back pain, diarrhea, fever, urination pain, vomiting)       Lower right abdominal pain, constant x 2 days moderate. Has been coming and going x 1 year. Not able to work out in gym 11. PREGNANCY: Is there any chance you are pregnant? When was your last menstrual period?       na  Protocols used: Abdominal Pain - [facility]

## 2023-10-02 DIAGNOSIS — R102 Pelvic and perineal pain: Secondary | ICD-10-CM | POA: Diagnosis not present

## 2023-10-02 DIAGNOSIS — Z8719 Personal history of other diseases of the digestive system: Secondary | ICD-10-CM | POA: Diagnosis not present

## 2023-10-02 DIAGNOSIS — K8020 Calculus of gallbladder without cholecystitis without obstruction: Secondary | ICD-10-CM | POA: Diagnosis not present

## 2023-10-03 ENCOUNTER — Ambulatory Visit: Admitting: Internal Medicine

## 2023-10-10 ENCOUNTER — Ambulatory Visit: Admitting: Behavioral Health

## 2023-10-29 DIAGNOSIS — D259 Leiomyoma of uterus, unspecified: Secondary | ICD-10-CM | POA: Diagnosis not present

## 2023-10-29 DIAGNOSIS — R102 Pelvic and perineal pain: Secondary | ICD-10-CM | POA: Diagnosis not present

## 2023-11-05 ENCOUNTER — Encounter: Payer: Self-pay | Admitting: Internal Medicine

## 2023-11-05 ENCOUNTER — Ambulatory Visit (INDEPENDENT_AMBULATORY_CARE_PROVIDER_SITE_OTHER): Admitting: Internal Medicine

## 2023-11-05 VITALS — BP 122/80 | HR 70 | Temp 98.1°F | Resp 16 | Ht 67.0 in | Wt 206.5 lb

## 2023-11-05 DIAGNOSIS — R109 Unspecified abdominal pain: Secondary | ICD-10-CM | POA: Diagnosis not present

## 2023-11-05 DIAGNOSIS — G8929 Other chronic pain: Secondary | ICD-10-CM

## 2023-11-05 DIAGNOSIS — K429 Umbilical hernia without obstruction or gangrene: Secondary | ICD-10-CM | POA: Diagnosis not present

## 2023-11-05 NOTE — Progress Notes (Signed)
 Subjective:    Patient ID: [name] [name], female    DOB: Nov 02, 1980, 43 y.o.   MRN: [id number]  DOS:  11/05/2023 Type of visit - description: Acute  Ongoing abdominal pain, located at the right lower abdomen. States that OTC medicines like ibuprofen  do not help. The pain is going on for over a year. Lately it has been persistent throughout the day. Eating does not make it worse. Walking and moving around make it worse. Denies fever.  No nausea vomiting.  No blood in the stools. Saw GI, note reviewed.   Review of Systems See above   Past Medical History:  Diagnosis Date   AMA (advanced maternal age) multigravida 35+    Cholelithiases 11/2013   elevated LFTs   Gestational diabetes    Multiple allergies    chronic sneezing   Sickle cell trait    Uterine fibroid    Vaginal Pap smear, abnormal     Past Surgical History:  Procedure Laterality Date   CESAREAN SECTION WITH BILATERAL TUBAL LIGATION Bilateral 05/29/2013   Procedure: CESAREAN SECTION WITH BILATERAL TUBAL LIGATION;  Surgeon: Alm JAYSON Cook, MD;  Location: [facility];  Service: Obstetrics;  Laterality: Bilateral;  PRIMARY EDC 4/30   leg discrepancy     LEG SURGERY     --R--from absecss with reconstructive flap- 2004    Current Outpatient Medications  Medication Instructions   doxycycline  (VIBRA -TABS) 100 mg, Oral, 2 times daily, Take for 4 weeks after you return   hydrOXYzine  (ATARAX ) 25 mg, Oral, At bedtime PRN       Objective:   Physical Exam Abdominal:     BP 122/80   Pulse 70   Temp 98.1 F (36.7 C) (Oral)   Resp 16   Ht 5' 7 (1.702 m)   Wt 206 lb 8 oz (93.7 kg)   LMP 10/29/2023 (Approximate)   SpO2 98%   BMI 32.34 kg/m  General:   Well developed, NAD, BMI noted.  HEENT:  Normocephalic . Face symmetric, atraumatic Abdomen:  Not distended, soft, see graphic. Groins: No lymphadenopathy. Skin: Not pale. Not jaundice MSK: Hip rotation normal bilaterally.  Pain triggered by rotating the right  hip? Neurologic:  alert & oriented X3.  Speech normal, gait appropriate for age and unassisted Psych--  Cognition and judgment appear intact.  Cooperative with normal attention span and concentration.  Behavior appropriate. No anxious or depressed appearing.     Assessment   Assessment H/o Gestational diabetes Allergies Sickle cell trait 2015: abd pain --> elevated LFTs --> US  GB stones --> MRCP nl biliar tree, + GB stones , saw GI 11-2013, surgical referral failed; saw GI 2017, then saw surgery 11-2015, HIDA (-), no surgery  BTL Workup per GI: ---03-2022: US  abdomen cholelithiasis, leiomyomas ---05/27/2022: EGD 05/27/2022: Bx  Barrett's and H. pylori.RX abx,antibiotics.Next EGD 1 year H. pylori breathing test 08/21/2022: Negative ---08/27/2022: Negative colonoscopy, 10 years  PLAN: Right lower abdominal pain: Chronic, for over a year, chart is reviewed: --Patient seen by me  08/09/2022---had lower abdominal pain, subsequently as CT abdomen showed fibroid uterus, hepatic asteatosis, broad-based umbilical hernia. -- Note from GI 07/23/2023.:  History of Barrett's, next EGD 3 years. Cholelithiasis: Rec to continue monitoring symptoms. Pelvic pain: Recommended MRI, done on  10/29/2023: Enlarged fibroid uterus. Pain is going on for over a year. Patient was told by GI to discuss the pelvic MRI with me.  Encouraged to discuss it with gynecology. Etiology of pain unclear.  She is TTP near the  umbilicus where she has a hernia.  I wonder if this is playing a role on her right lower quadrant pain.  Also, pain could be from stretching the abdominal wall from a pendulous abdomen. Plan -Refer to general surgery for opinion. If they do not believe the pain is associated with a hernia, would recommend Ortho referral for possible right hip pain. RTC checkup in 3 months

## 2023-11-05 NOTE — Assessment & Plan Note (Signed)
 Right lower abdominal pain: Chronic, for over a year, chart is reviewed: --Patient seen by me  08/09/2022---had lower abdominal pain, subsequently as CT abdomen showed colitis, fibroid uterus, hepatic asteatosis, broad-based umbilical hernia. -- Note from GI 07/23/2023.:  History of Barrett's, next EGD 3 years. Cholelithiasis: Rec to continue monitoring symptoms. Pelvic pain: Recommended MRI, done on  10/29/2023: Enlarged fibroid uterus. Pain is going on for over a year. Patient was told by GI to discuss the pelvic MRI with me.  Encouraged to discuss it with gynecology. Etiology of pain unclear.  She is TTP near the umbilicus where she has a hernia.  I wonder if this is playing a role on her right lower quadrant pain.  Also, pain could be from stretching the abdominal wall from a pendulous abdomen. Plan -Refer to general surgery for opinion. If they do not believe the pain is associated with a hernia, would recommend Ortho referral for possible right hip pain. RTC checkup in 3 months

## 2023-11-05 NOTE — Patient Instructions (Signed)
 We are referring you to the general surgeon to discuss your pain.  You can consider seeing orthopedic doctor, hip pain?

## 2023-11-11 ENCOUNTER — Ambulatory Visit: Admitting: Internal Medicine

## 2024-02-04 ENCOUNTER — Ambulatory Visit: Admitting: Internal Medicine

## 2024-02-04 ENCOUNTER — Encounter: Payer: Self-pay | Admitting: Internal Medicine

## 2024-02-04 VITALS — BP 116/68 | HR 69 | Temp 97.6°F | Resp 16 | Ht 67.0 in | Wt 213.5 lb

## 2024-02-04 DIAGNOSIS — E119 Type 2 diabetes mellitus without complications: Secondary | ICD-10-CM | POA: Diagnosis not present

## 2024-02-04 DIAGNOSIS — M62838 Other muscle spasm: Secondary | ICD-10-CM | POA: Diagnosis not present

## 2024-02-04 DIAGNOSIS — E66811 Obesity, class 1: Secondary | ICD-10-CM

## 2024-02-04 NOTE — Patient Instructions (Signed)
 GO TO THE LAB :  Get the blood work    Then, go to the front desk for the checkout Please make an appointment for a physical exam in about 4 months       LEFT UPPER EXTREMITY MUSCLE SPASM: You have been experiencing muscle spasms in your  arm, which are likely benign and similar to other common muscle spasms. -Monitor your symptoms for one week to ten days. -If the symptoms persist, we may consider prescribing a muscle relaxant. -Seek urgent care if you experience severe neck pain, chest pain, or difficulty breathing.  OBESITY: You have gained seven pounds since your last visit. -Make dietary modifications to help with weight loss. -Engage in regular exercise, aiming for at least three hours per week.

## 2024-02-04 NOTE — Assessment & Plan Note (Signed)
 Left upper extremity muscle spasm New problem. 2 days ago developed what she describes as palpitations on the left shoulder and left arm.  On further description they sound like likely benign muscle spasm, similar to blepharospasm. No signs of serious conditions. - Monitor symptoms for one week to ten days. - Consider muscle relaxant if symptoms persist. - Advised urgent care for severe neck pain, chest pain, or dyspnea. DM: A1c was 6.6 in 2024, took Zepbound  for weight management from March to May 2024; last A1c was 5.5 from March 2025. Plan: BMP, A1c.  Depending on results he may qualify for pharmacological treatment for DM  Obesity Weight gain of seven pounds since last visit. Previous weight loss with Zepbound . Rx: Dietary modifications and regular exercise, aiming for at least three hours per week. Preventive care: Has consistently declined vaccines and declines again today RTC 4 months CPX

## 2024-02-04 NOTE — Progress Notes (Signed)
 "  Subjective:    Patient ID: [name] [name], female    DOB: 06/29/1980, 43 y.o.   MRN: [id number]  DOS:  02/04/2024 Follow-up Discussed the use of AI scribe software for clinical note transcription with the patient, who gave verbal consent to proceed.  History of Present Illness [name] [name] is a 43 year old female who presents for follow-up.  Only concern is spasms.  See below.  Left hand muscle spasms and abnormal sensations - Acute onset of 'palpitating' or 'pumping' sensation localized to the left shoulder and deltoid area - Symptoms began suddenly two days ago at 5 AM and recurred that evening - Stretching and local heat provided partial relief - Attempted to record visible muscle movement on video  Associated symptoms and negative findings - No neck pain - No chest pain - No arm swelling - No color change in the arm - No arm injury  Medication use - Not taking any medications    Wt Readings from Last 3 Encounters:  02/04/24 213 lb 8 oz (96.8 kg)  11/05/23 206 lb 8 oz (93.7 kg)  07/15/23 202 lb 6 oz (91.8 kg)     Review of Systems See above   Past Medical History:  Diagnosis Date   AMA (advanced maternal age) multigravida 35+    Cholelithiases 11/2013   elevated LFTs   Gestational diabetes    Multiple allergies    chronic sneezing   Sickle cell trait    Uterine fibroid    Vaginal Pap smear, abnormal     Past Surgical History:  Procedure Laterality Date   CESAREAN SECTION WITH BILATERAL TUBAL LIGATION Bilateral 05/29/2013   Procedure: CESAREAN SECTION WITH BILATERAL TUBAL LIGATION;  Surgeon: Alm JAYSON Cook, MD;  Location: [facility];  Service: Obstetrics;  Laterality: Bilateral;  PRIMARY EDC 4/30   leg discrepancy     LEG SURGERY     --R--from absecss with reconstructive flap- 2004    No current outpatient medications     Objective:   Physical Exam BP 116/68   Pulse 69   Temp 97.6 F (36.4 C) (Oral)   Resp 16   Ht 5' 7 (1.702 m)   Wt 213 lb 8 oz  (96.8 kg)   SpO2 99%   BMI 33.44 kg/m  General:   Well developed, NAD, BMI noted. HEENT:  Normocephalic . Face symmetric, atraumatic Neck: Normal range of motion Lungs:  CTA B Normal respiratory effort, no intercostal retractions, no accessory muscle use. Heart: RRR,  no murmur. Upper extremities: Symmetric, on inspection and palpation both upper extremities are normal.  No swelling, redness, tenderness to palpation.  No fasciculations noted. Lower extremities: no pretibial edema bilaterally  Skin: Not pale. Not jaundice Neurologic:  alert & oriented X3.  Speech normal, gait appropriate for age and unassisted.  Motor exam symmetric throughout Psych--  Cognition and judgment appear intact.  Cooperative with normal attention span and concentration.  Behavior appropriate. No anxious or depressed appearing.      Assessment      Assessment H/o Gestational diabetes Allergies Sickle cell trait Chronic abd pain, history of BTL: 2015:--> elevated LFTs --> US  GB stones --> MRCP nl biliar tree, + GB stones , saw GI 11-2013, surgical referral failed; saw GI 2017, then saw surgery 11-2015, HIDA (-), no surgery 2024 workup per GI: ---03-2022: US  abdomen cholelithiasis, leiomyomas ---05/27/2022: EGD 05/27/2022: Bx  Barrett's and H. pylori.RX abx,antibiotics.Next EGD 1 year H. pylori breathing test 08/21/2022: Negative ---08/27/2022: Negative colonoscopy, 10  years  Assessment and Plan Assessment & Plan Left upper extremity muscle spasm New problem. 2 days ago developed what she describes as palpitations on the left shoulder and left arm.  On further description they sound like likely benign muscle spasm, similar to blepharospasm. No signs of serious conditions. - Monitor symptoms for one week to ten days. - Consider muscle relaxant if symptoms persist. - Advised urgent care for severe neck pain, chest pain, or dyspnea. DM: A1c was 6.6 in 2024, took Zepbound  for weight management from  March to May 2024; last A1c was 5.5 from March 2025. Plan: BMP, A1c.  Depending on results he may qualify for pharmacological treatment for DM  Obesity Weight gain of seven pounds since last visit. Previous weight loss with Zepbound . Rx: Dietary modifications and regular exercise, aiming for at least three hours per week. Preventive care: Has consistently declined vaccines and declines again today RTC 4 months CPX    "

## 2024-02-05 LAB — HEMOGLOBIN A1C
Hgb A1c MFr Bld: 5.9 % — ABNORMAL HIGH
Mean Plasma Glucose: 123 mg/dL
eAG (mmol/L): 6.8 mmol/L

## 2024-02-05 LAB — BASIC METABOLIC PANEL WITH GFR
BUN: 13 mg/dL (ref 7–25)
CO2: 26 mmol/L (ref 20–32)
Calcium: 8.9 mg/dL (ref 8.6–10.2)
Chloride: 107 mmol/L (ref 98–110)
Creat: 0.77 mg/dL (ref 0.50–0.99)
Glucose, Bld: 113 mg/dL — ABNORMAL HIGH (ref 65–99)
Potassium: 4.3 mmol/L (ref 3.5–5.3)
Sodium: 138 mmol/L (ref 135–146)
eGFR: 97 mL/min/1.73m2

## 2024-02-06 ENCOUNTER — Ambulatory Visit: Payer: Self-pay | Admitting: Internal Medicine

## 2024-04-21 ENCOUNTER — Encounter: Admitting: Internal Medicine
